# Patient Record
Sex: Male | Born: 1935 | Race: White | Hispanic: No | Marital: Single | Smoking: Former smoker
Health system: Southern US, Community
[De-identification: ages and names within clinical notes are randomized; demographics above are authoritative.]

## PROBLEM LIST (undated history)

## (undated) DIAGNOSIS — I10 Essential (primary) hypertension: Secondary | ICD-10-CM

## (undated) DIAGNOSIS — I4891 Unspecified atrial fibrillation: Secondary | ICD-10-CM

## (undated) DIAGNOSIS — E785 Hyperlipidemia, unspecified: Secondary | ICD-10-CM

## (undated) DIAGNOSIS — H9190 Unspecified hearing loss, unspecified ear: Secondary | ICD-10-CM

## (undated) DIAGNOSIS — I509 Heart failure, unspecified: Secondary | ICD-10-CM

## (undated) DIAGNOSIS — M792 Neuralgia and neuritis, unspecified: Secondary | ICD-10-CM

## (undated) DIAGNOSIS — K219 Gastro-esophageal reflux disease without esophagitis: Secondary | ICD-10-CM

## (undated) HISTORY — PX: COLONOSCOPY: SHX174

## (undated) HISTORY — PX: JOINT REPLACEMENT: SHX530

---

## 2004-07-26 ENCOUNTER — Ambulatory Visit: Payer: Self-pay | Admitting: Otolaryngology

## 2004-08-07 ENCOUNTER — Ambulatory Visit: Payer: Self-pay | Admitting: Otolaryngology

## 2004-12-06 ENCOUNTER — Emergency Department: Payer: Self-pay | Admitting: Emergency Medicine

## 2007-01-29 ENCOUNTER — Ambulatory Visit: Payer: Self-pay | Admitting: Cardiovascular Disease

## 2007-10-17 ENCOUNTER — Ambulatory Visit: Payer: Self-pay | Admitting: Otolaryngology

## 2007-11-12 ENCOUNTER — Ambulatory Visit: Payer: Self-pay | Admitting: Otolaryngology

## 2008-03-28 ENCOUNTER — Ambulatory Visit: Payer: Self-pay | Admitting: Otolaryngology

## 2008-05-31 ENCOUNTER — Ambulatory Visit: Payer: Self-pay | Admitting: Unknown Physician Specialty

## 2008-12-16 ENCOUNTER — Emergency Department: Payer: Self-pay | Admitting: Emergency Medicine

## 2009-07-30 ENCOUNTER — Ambulatory Visit: Payer: Self-pay | Admitting: Otolaryngology

## 2009-09-10 ENCOUNTER — Ambulatory Visit: Payer: Self-pay | Admitting: Otolaryngology

## 2012-04-13 ENCOUNTER — Ambulatory Visit: Payer: Self-pay | Admitting: Ophthalmology

## 2012-04-19 ENCOUNTER — Ambulatory Visit: Payer: Self-pay | Admitting: Ophthalmology

## 2012-06-15 ENCOUNTER — Emergency Department: Payer: Self-pay | Admitting: Emergency Medicine

## 2012-06-22 ENCOUNTER — Emergency Department: Payer: Self-pay | Admitting: Emergency Medicine

## 2012-07-01 ENCOUNTER — Ambulatory Visit: Payer: Self-pay | Admitting: Specialist

## 2013-01-31 ENCOUNTER — Ambulatory Visit: Payer: Self-pay | Admitting: Ophthalmology

## 2013-01-31 LAB — POTASSIUM: Potassium: 3.3 mmol/L — ABNORMAL LOW (ref 3.5–5.1)

## 2013-02-07 ENCOUNTER — Ambulatory Visit: Payer: Self-pay | Admitting: Ophthalmology

## 2014-08-22 NOTE — Op Note (Signed)
PATIENT NAME:  Daniel Barr, Daniel Barr MR#:  [id number] DATE OF BIRTH:  11-18-1931  DATE OF PROCEDURE:  04/19/2012  PREOPERATIVE DIAGNOSIS:  Cataract, left eye.   POSTOPERATIVE DIAGNOSIS:  Cataract, left eye.   PROCEDURE PERFORMED:  Extracapsular cataract extraction using phacoemulsification with placement Alcon [id number], 21.0 diopter posterior chamber lens, serial #[id number].   SURGEON: Maylon PeppersSteven A. Maelyn Berrey, M.D.   ANESTHESIA: 4% lidocaine and 0.75% Marcaine a 50-50 mixture with 10 units/mL of Hylenex  added, given as a peribulbar.   ANESTHESIOLOGIST:  Yves DillPaul Carroll, MD  COMPLICATIONS:  None.   ESTIMATED BLOOD LOSS:  Less than 1 mL.    DESCRIPTION OF PROCEDURE:  The patient was brought to the operating room and given a peribulbar block.  The patient was then prepped and draped in the usual fashion.  The vertical rectus muscles were imbricated using 5-0 silk sutures.  These sutures were then clamped to the sterile drapes as bridle sutures.  A limbal peritomy was performed extending two clock hours and hemostasis was obtained with cautery.  A partial thickness scleral groove was made at the surgical limbus and dissected anteriorly in a lamellar dissection using an Alcon crescent knife.  The anterior chamber was entered supero-temporally with a Superblade and through the lamellar dissection with a 2.6 mm keratome.  DisCoVisc was used to replace the aqueous and a continuous tear capsulorrhexis was carried out.  Hydrodissection and hydrodelineation were carried out with balanced salt and a 27 gauge canula.  The nucleus was rotated to confirm the effectiveness of the hydrodissection.  Phacoemulsification was carried out using a divide-and-conquer technique.  Total ultrasound time was 1 minute, 57.2 seconds with an average power of 22.5 percent.  CDE of 42.24.  Irrigation/aspiration was used to remove the residual cortex.  DisCoVisc was used to inflate the capsule and the internal incision was enlarged to 3  mm with the crescent knife.  The intraocular lens was folded and inserted into the capsular bag using the Alcon AcrySert delivery system.  Irrigation/aspiration was used to remove the residual DisCoVisc.  Miostat was injected into the anterior chamber through the paracentesis track to inflate the anterior chamber and induce miosis.  The wound was checked for leaks and none were found. The conjunctiva was closed with cautery and the bridle sutures were removed.  Two drops of 0.3% Vigamox were placed on the eye.   An eye shield was placed on the eye.  The patient was discharged to the recovery room in good condition.    ____________________________ Maylon PeppersSteven A. Johniya Durfee, MD sad:ct D: 04/19/2012 13:17:54 ET T: 04/20/2012 12:00:33 ET JOB#: [id number]  cc: Viviann SpareSteven A. Manali Mcelmurry, MD, <Dictator> Erline LevineSTEVEN A Treysean Petruzzi MD ELECTRONICALLY SIGNED 04/26/2012 12:49

## 2014-08-25 NOTE — Op Note (Signed)
PATIENT NAME:  Daniel Barr, Josten R MR#:  [id number] DATE OF BIRTH:  26-Jun-1931  DATE OF PROCEDURE:  02/06/2013  PREOPERATIVE DIAGNOSIS: Cataract, right eye.   POSTOPERATIVE DIAGNOSIS: Cataract, right eye.  PROCEDURE PERFORMED: Extracapsular cataract extraction using phacoemulsification with placement Alcon [id number], 21.0 diopter posterior chamber lens, serial number [id number].   SURGEON: Maylon PeppersSteven A. Makaylie Dedeaux, M.D.   ANESTHESIA: 4% lidocaine and 0.75% Marcaine a 50-50 mixture with 10 units/mL of Hylenex added, given as a peribulbar.   COMPLICATIONS: None.   ESTIMATED BLOOD LOSS: Less than 1 mL.   DESCRIPTION OF PROCEDURE: The patient was brought to the operating room and given a peribulbar block.  The patient was then prepped and draped in the usual fashion.  The vertical rectus muscles were imbricated using 5-0 silk sutures.  These sutures were then clamped to the sterile drapes as bridle sutures.  A limbal peritomy was performed extending two clock hours and hemostasis was obtained with cautery.  A partial thickness scleral groove was made at the surgical limbus and then dissected anteriorly in a lamellar dissection with using an Alcon crescent knife.  The anterior chamber was entered superonasally with a Superblade and through the lamellar dissection with a 2.6-mm keratome.  DisCoVisc was used to replace the aqueous and a continuous tear capsulorrhexis was carried out.  Hydrodissection and hydrodelineation were carried out with balanced salt and a 27 gauge canula.  The nucleus was rotated to confirm the effectiveness of the hydrodissection.  Phacoemulsification was carried out using a divide-and-conquer technique.  Total ultrasound time was 1 minute and 53 seconds with an average power of 24.3 %. CDE of 52.08.  Irrigation/aspiration was used to remove the residual cortex.  DisCoVisc was used to inflate the capsule and the internal wound was enlarged to 3 mm with the crescent knife.  The  intraocular lens was inserted into the capsular bag using the AcrySert delivery system.  Irrigation/aspiration was used to remove the residual DisCoVisc.  Miostat was injected into the anterior chamber through the paracentesis track to inflate the anterior chamber and induce miosis.  A 0.1 mL of cefuroxime was injected via the paracentesis track containing 1 mg of drug.  The wound was checked for leaks and wound leakage was found.  A single 10-0 suture was placed across the incision, tied and the knot was rotated superiorly.  The conjunctiva was closed with cautery and the bridle sutures were removed.  Two drops of 0.3% Vigamox were placed on the eye.  An eye shield was placed on the eye.  The patient was discharged to the recovery room in good condition.      ____________________________ Maylon PeppersSteven A. Braelynne Garinger, MD sad:sg D: 02/07/2013 13:10:53 ET T: 02/07/2013 13:27:41 ET JOB#: [id number]  cc: Viviann SpareSteven A. Coltin Casher, MD, <Dictator> Erline LevineSTEVEN A Obera Stauch MD ELECTRONICALLY SIGNED 02/21/2013 12:25

## 2015-09-22 ENCOUNTER — Emergency Department: Payer: Medicare Other

## 2015-09-22 ENCOUNTER — Inpatient Hospital Stay
Admission: EM | Admit: 2015-09-22 | Discharge: 2015-09-25 | DRG: 917 | Disposition: A | Discharge status: Home / Self Care | Payer: Medicare Other | Attending: Internal Medicine | Admitting: Internal Medicine

## 2015-09-22 ENCOUNTER — Encounter: Payer: Self-pay | Admitting: Emergency Medicine

## 2015-09-22 DIAGNOSIS — Z79899 Other long term (current) drug therapy: Secondary | ICD-10-CM | POA: Diagnosis not present

## 2015-09-22 DIAGNOSIS — I110 Hypertensive heart disease with heart failure: Secondary | ICD-10-CM | POA: Diagnosis present

## 2015-09-22 DIAGNOSIS — Z809 Family history of malignant neoplasm, unspecified: Secondary | ICD-10-CM

## 2015-09-22 DIAGNOSIS — I482 Chronic atrial fibrillation: Secondary | ICD-10-CM | POA: Diagnosis present

## 2015-09-22 DIAGNOSIS — Z66 Do not resuscitate: Secondary | ICD-10-CM | POA: Diagnosis present

## 2015-09-22 DIAGNOSIS — T404X1A Poisoning by other synthetic narcotics, accidental (unintentional), initial encounter: Secondary | ICD-10-CM | POA: Diagnosis present

## 2015-09-22 DIAGNOSIS — T426X1A Poisoning by other antiepileptic and sedative-hypnotic drugs, accidental (unintentional), initial encounter: Secondary | ICD-10-CM | POA: Diagnosis present

## 2015-09-22 DIAGNOSIS — I4891 Unspecified atrial fibrillation: Secondary | ICD-10-CM | POA: Diagnosis present

## 2015-09-22 DIAGNOSIS — R4182 Altered mental status, unspecified: Secondary | ICD-10-CM

## 2015-09-22 DIAGNOSIS — Z8249 Family history of ischemic heart disease and other diseases of the circulatory system: Secondary | ICD-10-CM

## 2015-09-22 DIAGNOSIS — D72829 Elevated white blood cell count, unspecified: Secondary | ICD-10-CM | POA: Diagnosis present

## 2015-09-22 DIAGNOSIS — Z9660 Presence of unspecified orthopedic joint implant: Secondary | ICD-10-CM | POA: Diagnosis present

## 2015-09-22 DIAGNOSIS — E785 Hyperlipidemia, unspecified: Secondary | ICD-10-CM | POA: Diagnosis present

## 2015-09-22 DIAGNOSIS — T481X1A Poisoning by skeletal muscle relaxants [neuromuscular blocking agents], accidental (unintentional), initial encounter: Principal | ICD-10-CM | POA: Diagnosis present

## 2015-09-22 DIAGNOSIS — G9340 Encephalopathy, unspecified: Secondary | ICD-10-CM | POA: Diagnosis present

## 2015-09-22 DIAGNOSIS — E876 Hypokalemia: Secondary | ICD-10-CM | POA: Diagnosis not present

## 2015-09-22 DIAGNOSIS — E860 Dehydration: Secondary | ICD-10-CM | POA: Diagnosis present

## 2015-09-22 DIAGNOSIS — R531 Weakness: Secondary | ICD-10-CM

## 2015-09-22 DIAGNOSIS — Z823 Family history of stroke: Secondary | ICD-10-CM

## 2015-09-22 DIAGNOSIS — Z87891 Personal history of nicotine dependence: Secondary | ICD-10-CM

## 2015-09-22 DIAGNOSIS — K219 Gastro-esophageal reflux disease without esophagitis: Secondary | ICD-10-CM | POA: Diagnosis present

## 2015-09-22 DIAGNOSIS — F0390 Unspecified dementia without behavioral disturbance: Secondary | ICD-10-CM | POA: Diagnosis present

## 2015-09-22 DIAGNOSIS — I509 Heart failure, unspecified: Secondary | ICD-10-CM | POA: Diagnosis present

## 2015-09-22 DIAGNOSIS — H9190 Unspecified hearing loss, unspecified ear: Secondary | ICD-10-CM | POA: Diagnosis present

## 2015-09-22 DIAGNOSIS — I10 Essential (primary) hypertension: Secondary | ICD-10-CM | POA: Diagnosis present

## 2015-09-22 HISTORY — DX: Gastro-esophageal reflux disease without esophagitis: K21.9

## 2015-09-22 HISTORY — DX: Unspecified hearing loss, unspecified ear: H91.90

## 2015-09-22 HISTORY — DX: Neuralgia and neuritis, unspecified: M79.2

## 2015-09-22 HISTORY — DX: Heart failure, unspecified: I50.9

## 2015-09-22 HISTORY — DX: Essential (primary) hypertension: I10

## 2015-09-22 HISTORY — DX: Unspecified atrial fibrillation: I48.91

## 2015-09-22 HISTORY — DX: Hyperlipidemia, unspecified: E78.5

## 2015-09-22 LAB — CBC
HCT: 46.5 % (ref 40.0–52.0)
Hemoglobin: 15.7 g/dL (ref 13.0–18.0)
MCH: 29.9 pg (ref 26.0–34.0)
MCHC: 33.7 g/dL (ref 32.0–36.0)
MCV: 88.6 fL (ref 80.0–100.0)
Platelets: 331 10*3/uL (ref 150–440)
RBC: 5.25 MIL/uL (ref 4.40–5.90)
RDW: 15.5 % — AB (ref 11.5–14.5)
WBC: 13.8 10*3/uL — ABNORMAL HIGH (ref 3.8–10.6)

## 2015-09-22 LAB — URINE DRUG SCREEN, QUALITATIVE (ARMC ONLY)
AMPHETAMINES, UR SCREEN: NOT DETECTED
BENZODIAZEPINE, UR SCRN: NOT DETECTED
Barbiturates, Ur Screen: NOT DETECTED
CANNABINOID 50 NG, UR ~~LOC~~: NOT DETECTED
Cocaine Metabolite,Ur ~~LOC~~: NOT DETECTED
MDMA (ECSTASY) UR SCREEN: NOT DETECTED
Methadone Scn, Ur: NOT DETECTED
Opiate, Ur Screen: NOT DETECTED
Phencyclidine (PCP) Ur S: NOT DETECTED
TRICYCLIC, UR SCREEN: NOT DETECTED

## 2015-09-22 LAB — DIFFERENTIAL
BASOS ABS: 0.1 10*3/uL (ref 0–0.1)
BASOS PCT: 1 %
BLASTS: 0 %
Band Neutrophils: 0 %
EOS PCT: 0 %
Eosinophils Absolute: 0 10*3/uL (ref 0–0.7)
LYMPHS PCT: 16 %
Lymphs Abs: 2.2 10*3/uL (ref 1.0–3.6)
MONOS PCT: 10 %
MYELOCYTES: 0 %
Metamyelocytes Relative: 0 %
Monocytes Absolute: 1.4 10*3/uL — ABNORMAL HIGH (ref 0.2–1.0)
NEUTROS ABS: 10.1 10*3/uL — AB (ref 1.4–6.5)
NEUTROS PCT: 73 %
Other: 0 %
Promyelocytes Absolute: 0 %
nRBC: 0 /100 WBC

## 2015-09-22 LAB — COMPREHENSIVE METABOLIC PANEL
ALK PHOS: 62 U/L (ref 38–126)
ALT: 24 U/L (ref 17–63)
AST: 44 U/L — ABNORMAL HIGH (ref 15–41)
Albumin: 4.1 g/dL (ref 3.5–5.0)
Anion gap: 11 (ref 5–15)
BILIRUBIN TOTAL: 1 mg/dL (ref 0.3–1.2)
BUN: 10 mg/dL (ref 6–20)
CALCIUM: 9 mg/dL (ref 8.9–10.3)
CO2: 24 mmol/L (ref 22–32)
CREATININE: 1.05 mg/dL (ref 0.61–1.24)
Chloride: 109 mmol/L (ref 101–111)
GFR calc Af Amer: >60 mL/min (ref >60)
GFR calc non Af Amer: >60 mL/min (ref >60)
GLUCOSE: 116 mg/dL — AB (ref 65–99)
Potassium: 2.7 mmol/L — CL (ref 3.5–5.1)
Sodium: 144 mmol/L (ref 135–145)
TOTAL PROTEIN: 7.3 g/dL (ref 6.5–8.1)

## 2015-09-22 LAB — URINALYSIS COMPLETE WITH MICROSCOPIC (ARMC ONLY)
BILIRUBIN URINE: NEGATIVE
Bacteria, UA: NONE SEEN
Glucose, UA: NEGATIVE mg/dL
Leukocytes, UA: NEGATIVE
Nitrite: NEGATIVE
PROTEIN: 100 mg/dL — AB
Specific Gravity, Urine: 1.021 (ref 1.005–1.030)
pH: 5 (ref 5.0–8.0)

## 2015-09-22 LAB — ACETAMINOPHEN LEVEL: Acetaminophen (Tylenol), Serum: <10 ug/mL — ABNORMAL LOW (ref 10–30)

## 2015-09-22 LAB — SALICYLATE LEVEL

## 2015-09-22 LAB — ETHANOL

## 2015-09-22 LAB — TROPONIN I

## 2015-09-22 MED ORDER — SODIUM CHLORIDE 0.9 % IV BOLUS (SEPSIS)
500.0000 mL | Freq: Once | INTRAVENOUS | Status: AC
  Administered 2015-09-22: 500 mL via INTRAVENOUS

## 2015-09-22 MED ORDER — PANTOPRAZOLE SODIUM 40 MG PO TBEC
40.0000 mg | DELAYED_RELEASE_TABLET | Freq: Every day | ORAL | Status: DC
  Administered 2015-09-23 – 2015-09-25 (×3): 40 mg via ORAL
  Filled 2015-09-22 (×4): qty 1

## 2015-09-22 MED ORDER — SOTALOL HCL 80 MG PO TABS
80.0000 mg | ORAL_TABLET | Freq: Two times a day (BID) | ORAL | Status: DC
  Administered 2015-09-23 – 2015-09-25 (×6): 80 mg via ORAL
  Filled 2015-09-22 (×8): qty 1

## 2015-09-22 MED ORDER — ENOXAPARIN SODIUM 40 MG/0.4ML ~~LOC~~ SOLN
40.0000 mg | SUBCUTANEOUS | Status: DC
  Administered 2015-09-23 – 2015-09-24 (×2): 40 mg via SUBCUTANEOUS
  Filled 2015-09-22 (×2): qty 0.4

## 2015-09-22 MED ORDER — HYDRALAZINE HCL 20 MG/ML IJ SOLN
10.0000 mg | INTRAMUSCULAR | Status: DC | PRN
  Administered 2015-09-22 – 2015-09-23 (×2): 10 mg via INTRAVENOUS
  Filled 2015-09-22 (×2): qty 1

## 2015-09-22 MED ORDER — SODIUM CHLORIDE 0.9% FLUSH
3.0000 mL | Freq: Two times a day (BID) | INTRAVENOUS | Status: DC
  Administered 2015-09-23 – 2015-09-25 (×6): 3 mL via INTRAVENOUS

## 2015-09-22 MED ORDER — ONDANSETRON HCL 4 MG/2ML IJ SOLN: 4.0000 mg | Freq: Four times a day (QID) | INTRAMUSCULAR | Status: DC | PRN

## 2015-09-22 MED ORDER — SODIUM CHLORIDE 0.9 % IV SOLN
INTRAVENOUS | Status: DC
  Administered 2015-09-22 – 2015-09-23 (×2): via INTRAVENOUS

## 2015-09-22 MED ORDER — POTASSIUM CHLORIDE CRYS ER 20 MEQ PO TBCR
40.0000 meq | EXTENDED_RELEASE_TABLET | Freq: Once | ORAL | Status: AC
  Administered 2015-09-22: 40 meq via ORAL
  Filled 2015-09-22: qty 2

## 2015-09-22 MED ORDER — ACETAMINOPHEN 325 MG PO TABS
650.0000 mg | ORAL_TABLET | Freq: Four times a day (QID) | ORAL | Status: DC | PRN
  Administered 2015-09-23: 650 mg via ORAL

## 2015-09-22 MED ORDER — ACETAMINOPHEN 650 MG RE SUPP: 650.0000 mg | Freq: Four times a day (QID) | RECTAL | Status: DC | PRN

## 2015-09-22 MED ORDER — IOPAMIDOL (ISOVUE-370) INJECTION 76%
75.0000 mL | Freq: Once | INTRAVENOUS | Status: AC | PRN
  Administered 2015-09-22: 75 mL via INTRAVENOUS

## 2015-09-22 MED ORDER — ONDANSETRON HCL 4 MG PO TABS: 4.0000 mg | ORAL_TABLET | Freq: Four times a day (QID) | ORAL | Status: DC | PRN

## 2015-09-22 NOTE — ED Notes (Signed)
Pt to ed with son who reports recent altered mental status and confusion progressively worse over the last 10 days.  Per son pt lives a lone and was found this am with pills everywhere at home, on the floor and all over the table, pt acutely confused.  Pt alert to self and date of birth.  Pt confused to place.  Pt very hard of hearing.

## 2015-09-22 NOTE — H&P (Signed)
[facility] - [place] at [facility]   PATIENT NAME: Daniel Barr    MR#:  [id number]  DATE OF BIRTH:  1931-09-19  DATE OF ADMISSION:  09/22/2015  PRIMARY CARE PHYSICIAN: Derwood Kaplan, MD   REQUESTING/REFERRING PHYSICIAN: Inocencio Homes, MD  CHIEF COMPLAINT:   Chief Complaint  Patient presents with  . Altered Mental Status    HISTORY OF PRESENT ILLNESS:  Daniel Barr  is a 82 y.o. male who presents with Increasing confusion for the past 5-6 days, very much acutely worse over the last 2 days. Patient is unable to contribute to his own history of present illness due to his confusion. History is taken from his son who is present at bedside with him. His son states the patient lives at home alone and has been able to complete his activities of daily living and care for himself without much difficulty until the past week or so. He states that a family friend told him that patient has been calling her for the past 5-6 days and had been confused about time of day frequently. Over the last 2 days the patient has been disoriented to place, time, circumstance. Son states that when he arrived to the patient's home his medications were not appropriately laid out, and some were even scattered around. He brought patient to the ED for evaluation. Workup here is largely negative initially in the ED, except for a very mild leukocytosis with a white count of 13.8, but a normal differential. No apparent infection found on imaging or lab workup. Hospitalists were called for admission and further evaluation.  PAST MEDICAL HISTORY:   Past Medical History  Diagnosis Date  . Hard of hearing   . Hypertension   . Nerve pain   . CHF (congestive heart failure) (HCC)   . GERD (gastroesophageal reflux disease)   . HLD (hyperlipidemia)   . A-fib [facility])     PAST SURGICAL HISTORY:   Past Surgical History  Procedure Laterality Date  . Colonoscopy    . Joint replacement      SOCIAL HISTORY:    Social History  Substance Use Topics  . Smoking status: Former Games developer  . Smokeless tobacco: Not on file  . Alcohol Use: Yes    FAMILY HISTORY:   Family History  Problem Relation Age of Onset  . Hypertension    . Stroke    . Cancer      DRUG ALLERGIES:  No Known Allergies  MEDICATIONS AT HOME:   Prior to Admission medications   Medication Sig Start Date End Date Taking? Authorizing Provider  sotalol (BETAPACE) 80 MG tablet Take 80 mg by mouth 2 (two) times daily.   Yes Historical Provider, MD    REVIEW OF SYSTEMS:  Review of Systems  Unable to perform ROS: acuity of condition     VITAL SIGNS:   Filed Vitals:   09/22/15 1700 09/22/15 1850 09/22/15 2003 09/22/15 2030  BP: 186/97 140/93 174/74 145/75  Pulse: 84 84 75 74  Temp:      TempSrc:      Resp: 17 14 20 15   Height:      Weight:      SpO2: 94% 94% 93% 95%   Wt Readings from Last 3 Encounters:  09/22/15 113.399 kg (250 lb)    PHYSICAL EXAMINATION:  Physical Exam  Vitals reviewed. Constitutional: He appears well-developed and well-nourished. No distress.  HENT:  Head: Normocephalic and atraumatic.  Mouth/Throat: Oropharynx is clear and moist.  Eyes: Conjunctivae and EOM are normal. Pupils are equal, round, and reactive to light. No scleral icterus.  Neck: Normal range of motion. Neck supple. No JVD present. No thyromegaly present.  Cardiovascular: Normal rate, regular rhythm and intact distal pulses.  Exam reveals no gallop and no friction rub.   No murmur heard. Respiratory: Effort normal and breath sounds normal. No respiratory distress. He has no wheezes. He has no rales.  GI: Soft. Bowel sounds are normal. He exhibits no distension. There is no tenderness.  Musculoskeletal: Normal range of motion. He exhibits no edema.  No arthritis, no gout  Lymphadenopathy:    He has no cervical adenopathy.  Neurological: He is alert. No cranial nerve deficit.  Responds to verbal stimuli, converses and  follows commands, but is confused in all communication.  No dysarthria, no aphasia  Skin: Skin is warm and dry. No rash noted. No erythema.  Psychiatric:  Unable to assess due to patient condition    LABORATORY PANEL:   CBC  Recent Labs Lab 09/22/15 1409  WBC 13.8*  HGB 15.7  HCT 46.5  PLT 331   ------------------------------------------------------------------------------------------------------------------  Chemistries   Recent Labs Lab 09/22/15 1409  NA 144  K 2.7*  CL 109  CO2 24  GLUCOSE 116*  BUN 10  CREATININE 1.05  CALCIUM 9.0  AST 44*  ALT 24  ALKPHOS 62  BILITOT 1.0   ------------------------------------------------------------------------------------------------------------------  Cardiac Enzymes  Recent Labs Lab 09/22/15 1409  TROPONINI <0.03   ------------------------------------------------------------------------------------------------------------------  RADIOLOGY:  Dg Chest 2 View  09/22/2015  CLINICAL DATA:  Altered mental status and confusion, progressively worse over the last 10 days. History of hypertension, CHF. EXAM: CHEST  2 VIEW COMPARISON:  None. FINDINGS: There is mild cardiomegaly. Upper mediastinum is widened to approximately 9 cm, possibly related to thyroid goiter, possibly mediastinal lipomatosis. Lungs are clear. No evidence of pneumonia. No pleural effusion or pneumothorax seen. No acute- appearing osseous abnormality. IMPRESSION: 1. Lungs are clear and there is no evidence of acute cardiopulmonary abnormality. 2. Mild cardiomegaly.  No evidence of CHF. 3. Widened mediastinum, possibly due to thyroid goiter, possibly mediastinal lipomatosis. No prior exams available to assess chronicity. If any chest pain, would certainly consider chest CT for further characterization. Electronically Signed   By: Bary Richard M.D.   On: 09/22/2015 16:48   Ct Head Wo Contrast  09/22/2015  CLINICAL DATA:  Altered mental status, confusion EXAM: CT  HEAD WITHOUT CONTRAST TECHNIQUE: Contiguous axial images were obtained from the base of the skull through the vertex without intravenous contrast. COMPARISON:  None. FINDINGS: No evidence of parenchymal hemorrhage or extra-axial fluid collection. No mass lesion, mass effect, or midline shift. No CT evidence of acute infarction. Small vessel ischemic changes, particularly in the subcortical right frontal lobe. Mild intracranial atherosclerosis. Global cortical atrophy.  No ventriculomegaly. New complete opacification of the right sphenoid sinus. Visualized paranasal sinuses and mastoid air cells otherwise clear. No evidence of calvarial fracture. IMPRESSION: No evidence of acute intracranial abnormality. Atrophy with small vessel ischemic changes. Electronically Signed   By: Charline Bills M.D.   On: 09/22/2015 16:10   Ct Angio Chest Aorta W/cm &/or Wo/cm  09/22/2015  CLINICAL DATA:  Acute onset of confusion. Widened mediastinum on chest radiograph. Initial encounter. EXAM: CT ANGIOGRAPHY CHEST WITH CONTRAST TECHNIQUE: Multidetector CT imaging of the chest was performed using the standard protocol during bolus administration of intravenous contrast. Multiplanar CT image reconstructions and MIPs were obtained to evaluate the vascular anatomy. CONTRAST:  75 mL of Isovue 370 IV contrast COMPARISON:  Chest radiograph performed earlier today at 4:05 p.m. FINDINGS: Scattered nodules are seen within the right lung, measuring up to 1.2 cm and 0.7 cm in size (image 33 of 60). Smaller nodules are seen on image 25 of 60, measuring 6 mm and 5 mm in size. Given the multiplicity of nodules, these are likely postinfectious in nature. Minimal bibasilar atelectasis is noted. No pleural effusion or pneumothorax is seen. The mediastinum is unremarkable in appearance. No mediastinal lymphadenopathy is seen. No pericardial effusion is identified. Scattered calcification is seen along the thoracic aorta. No mediastinal  lymphadenopathy is appreciated. The great vessels are grossly unremarkable in appearance. Apparent prominence of the superior mediastinum simply reflects fat and normal vasculature. Minimal hypodensities within the thyroid gland are likely benign, given their size. No axillary lymphadenopathy is seen. The visualized portions of the liver and spleen are grossly unremarkable appearance. The gallbladder is within normal limits. The visualized portions of the pancreas and right adrenal gland are within normal limits. Several nodules are seen at the left adrenal gland, measuring 1.9 cm and 2.0 cm in size. The visualized portions of the kidneys are grossly unremarkable, aside from mild nonspecific perinephric stranding. Mild scattered calcification is seen along the proximal abdominal aorta. No acute osseous abnormalities are identified. Review of the MIP images confirms the above findings. IMPRESSION: 1. Mediastinum is unremarkable in appearance. Prominence superior mediastinum to the reflects fat and normal vasculature. 2. Scattered right lung nodules, measuring up to 1.2 cm, are more likely postinfectious in nature, though PET/CT could be considered if deemed clinically appropriate to exclude underlying malignancy. 3. Minimal bibasilar atelectasis noted. 4. Several left adrenal gland nodules noted, measuring up to 2.0 cm in size. Would correlate with lab values and consider adrenal protocol MRI or CT for further evaluation. Electronically Signed   By: Roanna Raider M.D.   On: 09/22/2015 19:25    EKG:   Orders placed or performed during the hospital encounter of 09/22/15  . ED EKG  . ED EKG  . EKG 12-Lead  . EKG 12-Lead    IMPRESSION AND PLAN:  Principal Problem:   Acute encephalopathy - unclear etiology at this time. Seems unlikely to be infection, though blood and urine cultures were sent from the ED. Stroke is certainly one possibility here, the patient does not have any weakness or any other symptom  signs his confusion. Patient is on tramadol and Lyrica and Flexeril, which could be causing his confusion he took mistaken doses of these medicines. We will admit him here, hydrate him, monitor closely, and get a neurology consult for tomorrow to see if they recommend further neuro imaging. Active Problems:   HTN (hypertension) - at goal at this time, we will use when necessary IV antihypertensives until his home meds can be reconciled by pharmacy.   A-fib (HCC) - currently in sinus rhythm on sotalol, we're able to acquire this dose and will continue this medication for rate and rhythm control.   GERD (gastroesophageal reflux disease) - equivalent home dose PPI  All the records are reviewed and case discussed with ED provider. Management plans discussed with the patient and/or family.  DVT PROPHYLAXIS: SubQ lovenox  GI PROPHYLAXIS: PPI  ADMISSION STATUS: Inpatient  CODE STATUS: DNR Code Status History    This patient does not have a recorded code status. Please follow your organizational policy for patients in this situation.      TOTAL TIME TAKING CARE OF  THIS PATIENT: 50 minutes.    Enya Bureau FIELDING 09/22/2015, 9:06 PM  [facility] [place] [facility]  Office  [phone]  CC: Primary care physician; Derwood KaplanEason,  Ernest B, MD

## 2015-09-22 NOTE — ED Notes (Signed)
Pt transported to CT ?

## 2015-09-22 NOTE — ED Notes (Signed)
K 2.7 per lab, charge RN notified, will room pt shortly per charge.

## 2015-09-22 NOTE — ED Provider Notes (Signed)
[facility] Emergency Department Provider Note   ____________________________________________  Time seen: Approximately 3:30 PM  I have reviewed the triage vital signs and the nursing notes.   HISTORY  Chief Complaint Altered Mental Status  Caveat-history of present illness and review of systems is limited due to the patient's confusion as well as severe hardness of hearing. Information is obtained from his son at the bedside.  HPI Daniel Barr is a 82 y.o. male with history of atrial fibrillation on aspirin, hypertension, hyperlipidemia, GERD, history of CHF presents for evaluation of one week of worsening confusion, severe today, constant since onset, no modifying factors. Son reports that he has been very confused, talking about things that happened in 1975. Today his son found him at home disheveled with his pills scattered all over the house. Son denies any recent illness including no cough, vomiting, diarrhea, fevers or chills. Patient denies any pain complaints.   Past Medical History  Diagnosis Date  . Hard of hearing   . Hypertension   . Nerve pain   . CHF (congestive heart failure) (HCC)   . GERD (gastroesophageal reflux disease)     There are no active problems to display for this patient.   History reviewed. No pertinent past surgical history.  No current outpatient prescriptions on file.  Allergies Review of patient's allergies indicates no known allergies.  History reviewed. No pertinent family history.  Social History Social History  Substance Use Topics  . Smoking status: Former Games developer  . Smokeless tobacco: None  . Alcohol Use: Yes    Review of Systems  Caveat-history of present illness and review of systems is limited due to the patient's confusion as well as severe hardness of hearing. Information is obtained from his son at the bedside. ____________________________________________   PHYSICAL EXAM:  VITAL  SIGNS: ED Triage Vitals  Enc Vitals Group     BP 09/22/15 1400 170/97 mmHg     Pulse Rate 09/22/15 1400 48     Resp 09/22/15 1400 20     Temp 09/22/15 1400 97.7 F (36.5 C)     Temp Source 09/22/15 1400 Oral     SpO2 09/22/15 1400 94 %     Weight 09/22/15 1400 250 lb (113.399 kg)     Height 09/22/15 1400  (1.803 m)     Head Cir --      Peak Flow --      Pain Score 09/22/15 1400 0     Pain Loc --      Pain Edu? --      Excl. in GC? --     Constitutional: Alert and oriented to self only. Well appearing and in no acute distress. Very hard of hearing. Eyes: Conjunctivae are normal. PERRL. EOMI. Head: Atraumatic. Nose: No congestion/rhinnorhea. Mouth/Throat: Mucous membranes are moist.  Oropharynx non-erythematous. Neck: No stridor.  No cervical spine tenderness to palpation. Cardiovascular: Normal rate, regular rhythm. Grossly normal heart sounds.  Good peripheral circulation. Respiratory: Normal respiratory effort.  No retractions. Lungs CTAB. Gastrointestinal: Soft and nontender. No distention.  No CVA tenderness. Genitourinary: deferred Musculoskeletal: No lower extremity tenderness nor edema.  No joint effusions. Neurologic:  Normal speech and language. No gross focal neurologic deficits are appreciated. Follows commands to move all extremities and does so equally. Skin:  Skin is warm, dry and intact. No rash noted. Psychiatric: Mood and affect are normal. Speech and behavior are normal.  ____________________________________________   LABS (all labs ordered are listed,  but only abnormal results are displayed)  Labs Reviewed  COMPREHENSIVE METABOLIC PANEL - Abnormal; Notable for the following:    Potassium 2.7 (*)    Glucose, Bld 116 (*)    AST 44 (*)    All other components within normal limits  CBC - Abnormal; Notable for the following:    WBC 13.8 (*)    RDW 15.5 (*)    All other components within normal limits  URINALYSIS COMPLETEWITH MICROSCOPIC ([facility]  ONLY) - Abnormal; Notable for the following:    Color, Urine YELLOW (*)    APPearance CLEAR (*)    Ketones, ur TRACE (*)    Hgb urine dipstick 1+ (*)    Protein, ur 100 (*)    Squamous Epithelial / LPF 0-5 (*)    All other components within normal limits  ACETAMINOPHEN LEVEL - Abnormal; Notable for the following:    Acetaminophen (Tylenol), Serum <10 (*)    All other components within normal limits  CULTURE, BLOOD (ROUTINE X 2)  CULTURE, BLOOD (ROUTINE X 2)  TROPONIN I  SALICYLATE LEVEL  ETHANOL  URINE DRUG SCREEN, QUALITATIVE ([facility] ONLY)  CBG MONITORING, ED   ____________________________________________  EKG  ED ECG REPORT I, Gayla Doss, the attending physician, personally viewed and interpreted this ECG.   Date: 09/22/2015  EKG Time: 16:25  Rate: 74  Rhythm: normal sinus rhythm  Axis: normal  Intervals:none  ST&T Change: No acute ST elevation. No acute ST depression. Nonspecific T-wave abnormality. Wandering baseline somewhat limits the interpretation.  ____________________________________________  RADIOLOGY  CXR IMPRESSION: 1. Lungs are clear and there is no evidence of acute cardiopulmonary abnormality. 2. Mild cardiomegaly. No evidence of CHF. 3. Widened mediastinum, possibly due to thyroid goiter, possibly mediastinal lipomatosis. No prior exams available to assess chronicity. If any chest pain, would certainly consider chest CT for further characterization.  CT head IMPRESSION: No evidence of acute intracranial abnormality.  Atrophy with small vessel ischemic changes.  CTA chest IMPRESSION: 1. Mediastinum is unremarkable in appearance. Prominence superior mediastinum to the reflects fat and normal vasculature. 2. Scattered right lung nodules, measuring up to 1.2 cm, are more likely postinfectious in nature, though PET/CT could be considered if deemed clinically appropriate to exclude underlying malignancy. 3. Minimal bibasilar atelectasis  noted. 4. Several left adrenal gland nodules noted, measuring up to 2.0 cm in size. Would correlate with lab values and consider adrenal protocol MRI or CT for further evaluation.   ____________________________________________   PROCEDURES  Procedure(s) performed: None  Critical Care performed: No  ____________________________________________   INITIAL IMPRESSION / ASSESSMENT AND PLAN / ED COURSE  Pertinent labs & imaging results that were available during my care of the patient were reviewed by me and considered in my medical decision making (see chart for details).  Daniel Barr is a 82 y.o. male with history of atrial fibrillation on aspirin, hypertension, hyperlipidemia, GERD, history of CHF presents for evaluation of one week of worsening confusion. On exam, he is nontoxic appearing and in no acute distress, vital signs are stable, he is afebrile, he has no acute pain complaints but is only oriented to self. He appears to have an intact neurological examination. Reviewed are notable for severe hypokalemia, potassium of 2.7, we'll replete and anticipate admission for lab. His white blood count elevated at 13.8, we'll obtain chest x-ray as well as urinalysis to evaluate for possible infectious etiologies. Blood culture sent. Negative troponin, undetectable acetaminophen, salicylate and ethanol levels. CT head shows no acute  intracranial process. Anticipate admission given rapid decline in his mental status, ability to care for himself and the fact that he lives independently.  ----------------------------------------- 8:06 PM on 09/22/2015 ----------------------------------------- Chest x-ray showed no acute infectious process however there was a question of mediastinal widening. CTA chest confirms no widening of the mediastinum. Urinalysis is not consistent with infection. Case discussed with the hospitalist, Dr. Amado CoeGouru, for admission given his encephalopathy and  hypokalemia.  ____________________________________________   FINAL CLINICAL IMPRESSION(S) / ED DIAGNOSES  Final diagnoses:  Altered mental status, unspecified altered mental status type  Encephalopathy acute  Hypokalemia      NEW MEDICATIONS STARTED DURING THIS VISIT:  New Prescriptions   No medications on file     Note:  This document was prepared using Dragon voice recognition software and may include unintentional dictation errors.    Gayla DossEryka A Eliezer Khawaja, MD 09/22/15 2007

## 2015-09-23 DIAGNOSIS — G9340 Encephalopathy, unspecified: Secondary | ICD-10-CM

## 2015-09-23 LAB — BASIC METABOLIC PANEL
ANION GAP: 8 (ref 5–15)
BUN: 7 mg/dL (ref 6–20)
CALCIUM: 8.7 mg/dL — AB (ref 8.9–10.3)
CO2: 23 mmol/L (ref 22–32)
Chloride: 113 mmol/L — ABNORMAL HIGH (ref 101–111)
Creatinine, Ser: 0.71 mg/dL (ref 0.61–1.24)
Glucose, Bld: 123 mg/dL — ABNORMAL HIGH (ref 65–99)
Potassium: 2.7 mmol/L — CL (ref 3.5–5.1)
Sodium: 144 mmol/L (ref 135–145)

## 2015-09-23 LAB — CBC
HCT: 47.4 % (ref 40.0–52.0)
HEMOGLOBIN: 15.7 g/dL (ref 13.0–18.0)
MCH: 29.3 pg (ref 26.0–34.0)
MCHC: 33.3 g/dL (ref 32.0–36.0)
MCV: 88.2 fL (ref 80.0–100.0)
Platelets: 295 10*3/uL (ref 150–440)
RBC: 5.37 MIL/uL (ref 4.40–5.90)
RDW: 15.5 % — ABNORMAL HIGH (ref 11.5–14.5)
WBC: 14 10*3/uL — AB (ref 3.8–10.6)

## 2015-09-23 LAB — MAGNESIUM: MAGNESIUM: 2 mg/dL (ref 1.7–2.4)

## 2015-09-23 MED ORDER — ASPIRIN EC 81 MG PO TBEC
81.0000 mg | DELAYED_RELEASE_TABLET | Freq: Every day | ORAL | Status: DC
  Administered 2015-09-23 – 2015-09-25 (×3): 81 mg via ORAL
  Filled 2015-09-23 (×3): qty 1

## 2015-09-23 MED ORDER — TRAMADOL HCL 50 MG PO TABS
50.0000 mg | ORAL_TABLET | Freq: Four times a day (QID) | ORAL | Status: DC | PRN
  Administered 2015-09-23: 50 mg via ORAL
  Filled 2015-09-23: qty 1

## 2015-09-23 MED ORDER — ZIPRASIDONE MESYLATE 20 MG IM SOLR
10.0000 mg | Freq: Once | INTRAMUSCULAR | Status: AC
  Administered 2015-09-24: 10 mg via INTRAMUSCULAR
  Filled 2015-09-23 (×2): qty 20

## 2015-09-23 MED ORDER — POTASSIUM CHLORIDE CRYS ER 20 MEQ PO TBCR
20.0000 meq | EXTENDED_RELEASE_TABLET | Freq: Every day | ORAL | Status: DC
  Administered 2015-09-23 – 2015-09-24 (×2): 20 meq via ORAL
  Filled 2015-09-23 (×2): qty 1

## 2015-09-23 MED ORDER — AMLODIPINE BESYLATE 10 MG PO TABS
10.0000 mg | ORAL_TABLET | Freq: Every day | ORAL | Status: DC
  Administered 2015-09-23 – 2015-09-25 (×3): 10 mg via ORAL
  Filled 2015-09-23 (×3): qty 1

## 2015-09-23 MED ORDER — ENALAPRIL MALEATE 5 MG PO TABS
20.0000 mg | ORAL_TABLET | Freq: Two times a day (BID) | ORAL | Status: DC
  Administered 2015-09-23 – 2015-09-25 (×5): 20 mg via ORAL
  Filled 2015-09-23 (×5): qty 4

## 2015-09-23 MED ORDER — TORSEMIDE 20 MG PO TABS
20.0000 mg | ORAL_TABLET | Freq: Every day | ORAL | Status: DC
  Administered 2015-09-23 – 2015-09-25 (×3): 20 mg via ORAL
  Filled 2015-09-23 (×3): qty 1

## 2015-09-23 MED ORDER — POTASSIUM CHLORIDE CRYS ER 20 MEQ PO TBCR
40.0000 meq | EXTENDED_RELEASE_TABLET | Freq: Two times a day (BID) | ORAL | Status: AC
  Administered 2015-09-23 (×2): 40 meq via ORAL
  Filled 2015-09-23 (×2): qty 2

## 2015-09-23 NOTE — Progress Notes (Signed)
[facility] - [place] [facility]   PATIENT NAME: Daniel Barr    MR#:  [id number]  DATE OF BIRTH:  11-Sep-1931  SUBJECTIVE:  CHIEF COMPLAINT:   Chief Complaint  Patient presents with  . Altered Mental Status   The patient is very hard of hearing and demented. REVIEW OF SYSTEMS:  Demented, unable to get ROS.   DRUG ALLERGIES:  No Known Allergies  VITALS:  Blood pressure 175/79, pulse 83, temperature 98.8 F (37.1 C), temperature source Oral, resp. rate 20, height  (1.803 m), weight 102.604 kg (226 lb 3.2 oz), SpO2 95 %.  PHYSICAL EXAMINATION:  GENERAL:  82 y.o.-year-old patient lying in the bed with no acute distress.  EYES: Pupils equal, round, reactive to light and accommodation. No scleral icterus. Extraocular muscles intact.  HEENT: Head atraumatic, normocephalic. Oropharynx and nasopharynx clear.  NECK:  Supple, no jugular venous distention. No thyroid enlargement, no tenderness.  LUNGS: Normal breath sounds bilaterally, no wheezing, rales,rhonchi or crepitation. No use of accessory muscles of respiration.  CARDIOVASCULAR: S1, S2 normal. No murmurs, rubs, or gallops.  ABDOMEN: Soft, nontender, nondistended. Bowel sounds present. No organomegaly or mass.  EXTREMITIES: No pedal edema, cyanosis, or clubbing.  NEUROLOGIC: Cranial nerves II through XII are intact. Muscle strength 3/5 in all extremities. Sensation intact. Gait not checked.  PSYCHIATRIC: The patient is Demented.Marland Kitchen  SKIN: No obvious rash, lesion, or ulcer.    LABORATORY PANEL:   CBC  Recent Labs Lab 09/23/15 0607  WBC 14.0*  HGB 15.7  HCT 47.4  PLT 295   ------------------------------------------------------------------------------------------------------------------  Chemistries   Recent Labs Lab 09/22/15 1409 09/23/15 0607  NA 144 144  K 2.7* 2.7*  CL 109 113*  CO2 24 23  GLUCOSE 116* 123*  BUN 10 7  CREATININE 1.05 0.71  CALCIUM 9.0 8.7*  MG  --  2.0  AST  44*  --   ALT 24  --   ALKPHOS 62  --   BILITOT 1.0  --    ------------------------------------------------------------------------------------------------------------------  Cardiac Enzymes  Recent Labs Lab 09/22/15 1409  TROPONINI <0.03   ------------------------------------------------------------------------------------------------------------------  RADIOLOGY:  Dg Chest 2 View  09/22/2015  CLINICAL DATA:  Altered mental status and confusion, progressively worse over the last 10 days. History of hypertension, CHF. EXAM: CHEST  2 VIEW COMPARISON:  None. FINDINGS: There is mild cardiomegaly. Upper mediastinum is widened to approximately 9 cm, possibly related to thyroid goiter, possibly mediastinal lipomatosis. Lungs are clear. No evidence of pneumonia. No pleural effusion or pneumothorax seen. No acute- appearing osseous abnormality. IMPRESSION: 1. Lungs are clear and there is no evidence of acute cardiopulmonary abnormality. 2. Mild cardiomegaly.  No evidence of CHF. 3. Widened mediastinum, possibly due to thyroid goiter, possibly mediastinal lipomatosis. No prior exams available to assess chronicity. If any chest pain, would certainly consider chest CT for further characterization. Electronically Signed   By: Bary Richard M.D.   On: 09/22/2015 16:48   Ct Head Wo Contrast  09/22/2015  CLINICAL DATA:  Altered mental status, confusion EXAM: CT HEAD WITHOUT CONTRAST TECHNIQUE: Contiguous axial images were obtained from the base of the skull through the vertex without intravenous contrast. COMPARISON:  None. FINDINGS: No evidence of parenchymal hemorrhage or extra-axial fluid collection. No mass lesion, mass effect, or midline shift. No CT evidence of acute infarction. Small vessel ischemic changes, particularly in the subcortical right frontal lobe. Mild intracranial atherosclerosis. Global cortical atrophy.  No ventriculomegaly. New complete opacification of the right sphenoid  sinus.  Visualized paranasal sinuses and mastoid air cells otherwise clear. No evidence of calvarial fracture. IMPRESSION: No evidence of acute intracranial abnormality. Atrophy with small vessel ischemic changes. Electronically Signed   By: Charline BillsSriyesh  Krishnan M.D.   On: 09/22/2015 16:10   Ct Angio Chest Aorta W/cm &/or Wo/cm  09/22/2015  CLINICAL DATA:  Acute onset of confusion. Widened mediastinum on chest radiograph. Initial encounter. EXAM: CT ANGIOGRAPHY CHEST WITH CONTRAST TECHNIQUE: Multidetector CT imaging of the chest was performed using the standard protocol during bolus administration of intravenous contrast. Multiplanar CT image reconstructions and MIPs were obtained to evaluate the vascular anatomy. CONTRAST:  75 mL of Isovue 370 IV contrast COMPARISON:  Chest radiograph performed earlier today at 4:05 p.m. FINDINGS: Scattered nodules are seen within the right lung, measuring up to 1.2 cm and 0.7 cm in size (image 33 of 60). Smaller nodules are seen on image 25 of 60, measuring 6 mm and 5 mm in size. Given the multiplicity of nodules, these are likely postinfectious in nature. Minimal bibasilar atelectasis is noted. No pleural effusion or pneumothorax is seen. The mediastinum is unremarkable in appearance. No mediastinal lymphadenopathy is seen. No pericardial effusion is identified. Scattered calcification is seen along the thoracic aorta. No mediastinal lymphadenopathy is appreciated. The great vessels are grossly unremarkable in appearance. Apparent prominence of the superior mediastinum simply reflects fat and normal vasculature. Minimal hypodensities within the thyroid gland are likely benign, given their size. No axillary lymphadenopathy is seen. The visualized portions of the liver and spleen are grossly unremarkable appearance. The gallbladder is within normal limits. The visualized portions of the pancreas and right adrenal gland are within normal limits. Several nodules are seen at the left adrenal  gland, measuring 1.9 cm and 2.0 cm in size. The visualized portions of the kidneys are grossly unremarkable, aside from mild nonspecific perinephric stranding. Mild scattered calcification is seen along the proximal abdominal aorta. No acute osseous abnormalities are identified. Review of the MIP images confirms the above findings. IMPRESSION: 1. Mediastinum is unremarkable in appearance. Prominence superior mediastinum to the reflects fat and normal vasculature. 2. Scattered right lung nodules, measuring up to 1.2 cm, are more likely postinfectious in nature, though PET/CT could be considered if deemed clinically appropriate to exclude underlying malignancy. 3. Minimal bibasilar atelectasis noted. 4. Several left adrenal gland nodules noted, measuring up to 2.0 cm in size. Would correlate with lab values and consider adrenal protocol MRI or CT for further evaluation. Electronically Signed   By: Roanna RaiderJeffery  Chang M.D.   On: 09/22/2015 19:25    EKG:   Orders placed or performed during the hospital encounter of 09/22/15  . ED EKG  . ED EKG  . EKG 12-Lead  . EKG 12-Lead    ASSESSMENT AND PLAN:   Acute encephalopathy, likely combination of dementia and medication.  Patient was on tramadol and Lyrica and Flexeril, which could be causing his confusion he took mistaken doses of these medicines. Per neurology consult no further neuro imaging.  Possible dementia and very hard hearing. Follow and aspiration precaution. Physical therapy evaluation.  Hypokalemia. Give potassium supplement and follow-up level. Magnesium is normal.  Leukocytosis. Unclear ideology, no source of infection. Follow-up CBC.   HTN (hypertension). Continue home hypertension medication.   A-fib. Continue sotalol.    GERD (gastroesophageal reflux disease) - equivalent home dose PPI   I discussed with Dr. Nona DellZelikman, neurologist. All the records are reviewed and case discussed with Care Management/Social Workerr. Management  plans discussed  with the patient's son and he is in agreement.  CODE STATUS: DO NOT RESUSCITATE  TOTAL TIME TAKING CARE OF THIS PATIENT: 39 minutes.  Greater than 50% time was spent on coordination of care and face-to-face counseling.  POSSIBLE D/C IN 2 DAYS, DEPENDING ON CLINICAL CONDITION.   Shaune Pollack M.D on 09/23/2015 at 12:58 PM  Between 7am to 6pm - Pager - [phone]  After 6pm go to [email or web address] - password [email or web address] [facility]  [facility]  Office  [phone]  CC: Primary care physician; Derwood Kaplan, MD

## 2015-09-23 NOTE — Progress Notes (Signed)
Physical Therapy Evaluation Patient Details Name: Daniel CureBilly R Ding MRN: [id number] DOB: 07/19/1931 Today's Date: 09/23/2015   History of Present Illness  Horton ChinBilly Barr is a 82 y.o. male who presents with Increasing confusion for the past 5-6 days, very much acutely worse over the last 2 days.  Neuro consult negative for acute event.  Clinical Impression  Pt presents to PT with confusion and decreased safety with mobility and ambulation.  Pt lives alone with family nearby.  Pt active at baseline and was mowing the lawn and driving as little as a week ago.  Currently pt is able to get in and out of bed and transfer sit<>stand with supervision.  With ambulation, pt has a very quick gait speed and fatigues quickly.  Pt needed supervision with tactile cues for directions around nursing station and to walk a straight path as he tends to stagger/veer off course.  Pt would benefit from acute PT services to address objective findings.    Follow Up Recommendations Home health PT;Supervision/Assistance - 24 hour    Equipment Recommendations  None recommended by PT (has RW)    Recommendations for Other Services       Precautions / Restrictions Precautions Precautions: Fall Precaution Comments: HIGH Restrictions Weight Bearing Restrictions: No      Mobility  Bed Mobility Overal bed mobility: Modified Independent             General bed mobility comments: Supine<>sit with Mod I, able to exit bed to both L and R side.  Transfers Overall transfer level: Needs assistance Equipment used: Rolling walker (2 wheeled) Transfers: Sit to/from Stand Sit to Stand: Supervision         General transfer comment: Sit<>stand from bed in lowest position, using B UE's to push up, good body mechanics  Ambulation/Gait Ambulation/Gait assistance: Supervision Ambulation Distance (Feet): 150 Feet Assistive device: Rolling walker (2 wheeled) Gait Pattern/deviations: Step-through pattern;Staggering  left;Staggering right   Gait velocity interpretation: at or above normal speed for age/gender General Gait Details: Very fast gait speed and as a result fatigues quickly, needing standing rest break for 5-10 seconds after 80'.  Constant cues for directions around nurses station, pt attempting to enter other patient's room.  Stairs            Wheelchair Mobility    Modified Rankin (Stroke Patients Only)       Balance Overall balance assessment: Modified Independent                                           Pertinent Vitals/Pain Pain Assessment: No/denies pain    Home Living Family/patient expects to be discharged to:: Private residence Living Arrangements: Alone Available Help at Discharge: Family;Available PRN/intermittently Type of Home: House Home Access: Stairs to enter Entrance Stairs-Rails: Doctor, general practiceight;Left Entrance Stairs-Number of Steps: 5 Home Layout: One level Home Equipment: Walker - 2 wheels      Prior Function Level of Independence: Independent with assistive device(s)         Comments: Mowing brother's lawn and driving up until a week ago.     Hand Dominance        Extremity/Trunk Assessment   Upper Extremity Assessment: Overall WFL for tasks assessed           Lower Extremity Assessment: Overall WFL for tasks assessed         Communication   Communication: Other (  comment) (Confused and illogical, tangential)  Cognition Arousal/Alertness: Awake/alert (hyper alert) Behavior During Therapy: Restless Overall Cognitive Status: Impaired/Different from baseline Area of Impairment: Orientation;Attention;Following commands;Safety/judgement;Awareness Orientation Level: Disoriented to;Place;Situation;Time Current Attention Level: Divided   Following Commands: Follows one step commands inconsistently Safety/Judgement: Decreased awareness of safety Awareness: Anticipatory        General Comments      Exercises         Assessment/Plan    PT Assessment Patient needs continued PT services  PT Diagnosis Difficulty walking;Altered mental status   PT Problem List Decreased activity tolerance;Decreased cognition;Decreased safety awareness  PT Treatment Interventions Gait training;Stair training;Functional mobility training;Therapeutic activities;Therapeutic exercise;Balance training;Cognitive remediation;Patient/family education   PT Goals (Current goals can be found in the Care Plan section) Acute Rehab PT Goals Patient Stated Goal: None stated PT Goal Formulation: With patient Potential to Achieve Goals: Fair    Frequency Min 2X/week   Barriers to discharge Decreased caregiver support lives alone    Co-evaluation               End of Session Equipment Utilized During Treatment: Gait belt Activity Tolerance: Patient tolerated treatment well Patient left: in bed;with call bell/phone within reach;with bed alarm set;with family/visitor present Nurse Communication: Mobility status         Time: 4098-1191 PT Time Calculation (min) (ACUTE ONLY): 32 min   Charges:   PT Evaluation $PT Eval Low Complexity: 1 Procedure     PT G Codes:        Itzell Bendavid A Sawsan Riggio, PT 09/23/2015, 3:49 PM

## 2015-09-23 NOTE — Consult Note (Signed)
CC: AMS  HPI: Daniel Barr is an 82 y.o. male who presents with Increasing confusion for the past 5-6 days, very much acutely worse over the last 2 days.  Pt is apparently lives alone and self sufficient. Patient is unable to contribute to his own history of present illness due to his confusion. Pt was fond to have have a very mild leukocytosis with a white count of 13.8, but a normal differential. No apparent infection found on imaging or lab workup.  CTH no acute abnormalities found.   Past Medical History  Diagnosis Date  . Hard of hearing   . Hypertension   . Nerve pain   . CHF (congestive heart failure) (HCC)   . GERD (gastroesophageal reflux disease)   . HLD (hyperlipidemia)   . A-fib [facility])     Past Surgical History  Procedure Laterality Date  . Colonoscopy    . Joint replacement      Family History  Problem Relation Age of Onset  . Hypertension    . Stroke    . Cancer      Social History:  reports that he has quit smoking. He does not have any smokeless tobacco history on file. He reports that he drinks alcohol. He reports that he does not use illicit drugs.  No Known Allergies  Medications: I have reviewed the patient's current medications.  ROS: Unable to obtain due to confusion  Physical Examination: Blood pressure 175/79, pulse 83, temperature 98.8 F (37.1 C), temperature source Oral, resp. rate 20, height  (1.803 m), weight 102.604 kg (226 lb 3.2 oz), SpO2 95 %.    Neurological Examination Mental Status: Alert to name only. Cranial Nerves: II: Discs flat bilaterally; Visual fields grossly normal, pupils equal, round, reactive to light and accommodation III,IV, VI: ptosis not present, extra-ocular motions intact bilaterally V,VII: smile symmetric, facial light touch sensation normal bilaterally VIII: hearing normal bilaterally IX,X: gag reflex present XI: bilateral shoulder shrug XII: midline tongue extension Motor: Right : Upper extremity    4/5    Left:     Upper extremity   4/5  Lower extremity   4/5     Lower extremity   4/5 Tone and bulk:normal tone throughout; no atrophy noted Sensory: Pinprick and light touch intact throughout, bilaterally Deep Tendon Reflexes: 1+ and symmetric throughout Plantars: Right: downgoing   Left: downgoing Cerebellar: normal finger-to-nose, normal rapid alternating movements and normal heel-to-shin test Gait: not tested       Laboratory Studies:   Basic Metabolic Panel:  Recent Labs Lab 09/22/15 1409 09/23/15 0607  NA 144 144  K 2.7* 2.7*  CL 109 113*  CO2 24 23  GLUCOSE 116* 123*  BUN 10 7  CREATININE 1.05 0.71  CALCIUM 9.0 8.7*  MG  --  2.0    Liver Function Tests:  Recent Labs Lab 09/22/15 1409  AST 44*  ALT 24  ALKPHOS 62  BILITOT 1.0  PROT 7.3  ALBUMIN 4.1   No results for input(s): LIPASE, AMYLASE in the last 168 hours. No results for input(s): AMMONIA in the last 168 hours.  CBC:  Recent Labs Lab 09/22/15 1409 09/23/15 0607  WBC 13.8* 14.0*  NEUTROABS 10.1*  --   HGB 15.7 15.7  HCT 46.5 47.4  MCV 88.6 88.2  PLT 331 295    Cardiac Enzymes:  Recent Labs Lab 09/22/15 1409  TROPONINI <0.03    BNP: Invalid input(s): POCBNP  CBG: No results for input(s): GLUCAP in the  last 168 hours.  Microbiology: No results found for this or any previous visit.  Coagulation Studies: No results for input(s): LABPROT, INR in the last 72 hours.  Urinalysis:  Recent Labs Lab 09/22/15 1531  COLORURINE YELLOW*  LABSPEC 1.021  PHURINE 5.0  GLUCOSEU NEGATIVE  HGBUR 1+*  BILIRUBINUR NEGATIVE  KETONESUR TRACE*  PROTEINUR 100*  NITRITE NEGATIVE  LEUKOCYTESUR NEGATIVE    Lipid Panel:  No results found for: CHOL, TRIG, HDL, CHOLHDL, VLDL, LDLCALC  HgbA1C: No results found for: HGBA1C  Urine Drug Screen:     Component Value Date/Time   LABOPIA NONE DETECTED 09/22/2015 1630   COCAINSCRNUR NONE DETECTED 09/22/2015 1630   LABBENZ NONE DETECTED  09/22/2015 1630   AMPHETMU NONE DETECTED 09/22/2015 1630   THCU NONE DETECTED 09/22/2015 1630   LABBARB NONE DETECTED 09/22/2015 1630    Alcohol Level:  Recent Labs Lab 09/22/15 1409  ETH <5    Imaging: Dg Chest 2 View  09/22/2015  CLINICAL DATA:  Altered mental status and confusion, progressively worse over the last 10 days. History of hypertension, CHF. EXAM: CHEST  2 VIEW COMPARISON:  None. FINDINGS: There is mild cardiomegaly. Upper mediastinum is widened to approximately 9 cm, possibly related to thyroid goiter, possibly mediastinal lipomatosis. Lungs are clear. No evidence of pneumonia. No pleural effusion or pneumothorax seen. No acute- appearing osseous abnormality. IMPRESSION: 1. Lungs are clear and there is no evidence of acute cardiopulmonary abnormality. 2. Mild cardiomegaly.  No evidence of CHF. 3. Widened mediastinum, possibly due to thyroid goiter, possibly mediastinal lipomatosis. No prior exams available to assess chronicity. If any chest pain, would certainly consider chest CT for further characterization. Electronically Signed   By: Bary RichardStan  Maynard M.D.   On: 09/22/2015 16:48   Ct Head Wo Contrast  09/22/2015  CLINICAL DATA:  Altered mental status, confusion EXAM: CT HEAD WITHOUT CONTRAST TECHNIQUE: Contiguous axial images were obtained from the base of the skull through the vertex without intravenous contrast. COMPARISON:  None. FINDINGS: No evidence of parenchymal hemorrhage or extra-axial fluid collection. No mass lesion, mass effect, or midline shift. No CT evidence of acute infarction. Small vessel ischemic changes, particularly in the subcortical right frontal lobe. Mild intracranial atherosclerosis. Global cortical atrophy.  No ventriculomegaly. New complete opacification of the right sphenoid sinus. Visualized paranasal sinuses and mastoid air cells otherwise clear. No evidence of calvarial fracture. IMPRESSION: No evidence of acute intracranial abnormality. Atrophy with  small vessel ischemic changes. Electronically Signed   By: Charline BillsSriyesh  Krishnan M.D.   On: 09/22/2015 16:10   Ct Angio Chest Aorta W/cm &/or Wo/cm  09/22/2015  CLINICAL DATA:  Acute onset of confusion. Widened mediastinum on chest radiograph. Initial encounter. EXAM: CT ANGIOGRAPHY CHEST WITH CONTRAST TECHNIQUE: Multidetector CT imaging of the chest was performed using the standard protocol during bolus administration of intravenous contrast. Multiplanar CT image reconstructions and MIPs were obtained to evaluate the vascular anatomy. CONTRAST:  75 mL of Isovue 370 IV contrast COMPARISON:  Chest radiograph performed earlier today at 4:05 p.m. FINDINGS: Scattered nodules are seen within the right lung, measuring up to 1.2 cm and 0.7 cm in size (image 33 of 60). Smaller nodules are seen on image 25 of 60, measuring 6 mm and 5 mm in size. Given the multiplicity of nodules, these are likely postinfectious in nature. Minimal bibasilar atelectasis is noted. No pleural effusion or pneumothorax is seen. The mediastinum is unremarkable in appearance. No mediastinal lymphadenopathy is seen. No pericardial effusion is identified. Scattered calcification  is seen along the thoracic aorta. No mediastinal lymphadenopathy is appreciated. The great vessels are grossly unremarkable in appearance. Apparent prominence of the superior mediastinum simply reflects fat and normal vasculature. Minimal hypodensities within the thyroid gland are likely benign, given their size. No axillary lymphadenopathy is seen. The visualized portions of the liver and spleen are grossly unremarkable appearance. The gallbladder is within normal limits. The visualized portions of the pancreas and right adrenal gland are within normal limits. Several nodules are seen at the left adrenal gland, measuring 1.9 cm and 2.0 cm in size. The visualized portions of the kidneys are grossly unremarkable, aside from mild nonspecific perinephric stranding. Mild scattered  calcification is seen along the proximal abdominal aorta. No acute osseous abnormalities are identified. Review of the MIP images confirms the above findings. IMPRESSION: 1. Mediastinum is unremarkable in appearance. Prominence superior mediastinum to the reflects fat and normal vasculature. 2. Scattered right lung nodules, measuring up to 1.2 cm, are more likely postinfectious in nature, though PET/CT could be considered if deemed clinically appropriate to exclude underlying malignancy. 3. Minimal bibasilar atelectasis noted. 4. Several left adrenal gland nodules noted, measuring up to 2.0 cm in size. Would correlate with lab values and consider adrenal protocol MRI or CT for further evaluation. Electronically Signed   By: Roanna Raider M.D.   On: 09/22/2015 19:25     Assessment/Plan:  82 y.o. male who presents with Increasing confusion for the past 5-6 days, very much acutely worse over the last 2 days.  Pt is apparently lives alone and self sufficient. Patient is unable to contribute to his own history of present illness due to his confusion. Pt was fond to have have a very mild leukocytosis with a white count of 13.8, but a normal differential. No apparent infection found on imaging or lab workup.  CTH no acute abnormalities found.   Pt does not have any focal neurological deficits. He is confused.  WBC at 14 with likely dehydration and pt does have hypokelemia Con't hydration  No further testing from neuro stand point  [facility], Daniel Barr  09/23/2015, 12:57 PM

## 2015-09-23 NOTE — Progress Notes (Signed)
MEDICATION RELATED CONSULT NOTE - INITIAL   Pharmacy Consult for Electrolytes  Indication: Potassium replacement   No Known Allergies  Patient Measurements: Height: 5\' 11"  (180.3 cm) Weight: 226 lb 3.2 oz (102.604 kg) IBW/kg (Calculated) : 75.3 Adjusted Body Weight:   Vital Signs: Temp: 98.8 F (37.1 C) (05/21 0602) Temp Source: Oral (05/21 0602) BP: 175/79 mmHg (05/21 0602) Pulse Rate: 83 (05/21 0602) Intake/Output from previous day: 05/20 0701 - 05/21 0700 In: 146.7 [I.V.:146.7] Out: 1250 [Urine:1250] Intake/Output from this shift: Total I/O In: 903.2 [P.O.:120; I.V.:783.2] Out: 350 [Urine:350]  Labs:  Recent Labs  09/22/15 1409 09/23/15 0607  WBC 13.8* 14.0*  HGB 15.7 15.7  HCT 46.5 47.4  PLT 331 295  CREATININE 1.05 0.71  MG  --  2.0  ALBUMIN 4.1  --   PROT 7.3  --   AST 44*  --   ALT 24  --   ALKPHOS 62  --   BILITOT 1.0  --    Estimated Creatinine Clearance: 88.3 mL/min (by C-G formula based on Cr of 0.71).   Microbiology: No results found for this or any previous visit (from the past 720 hour(s)).  Medical History: Past Medical History  Diagnosis Date  . Hard of hearing   . Hypertension   . Nerve pain   . CHF (congestive heart failure) (HCC)   . GERD (gastroesophageal reflux disease)   . HLD (hyperlipidemia)   . A-fib (HCC)     Medications:  Scheduled:  . amLODipine  10 mg Oral Daily  . aspirin EC  81 mg Oral Daily  . enalapril  20 mg Oral BID  . enoxaparin (LOVENOX) injection  40 mg Subcutaneous Q24H  . pantoprazole  40 mg Oral Daily  . potassium chloride SA  20 mEq Oral Daily  . potassium chloride  40 mEq Oral BID  . sodium chloride flush  3 mL Intravenous Q12H  . sotalol  80 mg Oral BID  . torsemide  20 mg Oral Daily    Assessment: 5/21 :  K = 2.7  Goal of Therapy:  K :  3.5 - 5.1   Plan:  KCl 20 mEq PO X 1 given on 5/20. KCl 40 mEq PO BID X 2 doses  PLUS 20 mEq PO daily on 5/21 to make total dose of 100 mEq on  5/21. KCl 20 mEq PO daily starting on 5/22.  Pharmacy to follow and manage thereafter.   BMP on 5/22 with AM labs.   Daniel Barr D 09/23/2015,10:35 AM

## 2015-09-23 NOTE — Progress Notes (Signed)
Dr Imogene Burnhen notified of critical K+ of 2.7

## 2015-09-24 ENCOUNTER — Inpatient Hospital Stay: Payer: Medicare Other

## 2015-09-24 LAB — BASIC METABOLIC PANEL
ANION GAP: 7 (ref 5–15)
BUN: 10 mg/dL (ref 6–20)
CHLORIDE: 115 mmol/L — AB (ref 101–111)
CO2: 22 mmol/L (ref 22–32)
Calcium: 8.9 mg/dL (ref 8.9–10.3)
Creatinine, Ser: 0.93 mg/dL (ref 0.61–1.24)
GFR calc non Af Amer: >60 mL/min (ref >60)
Glucose, Bld: 104 mg/dL — ABNORMAL HIGH (ref 65–99)
POTASSIUM: 3.3 mmol/L — AB (ref 3.5–5.1)
SODIUM: 144 mmol/L (ref 135–145)

## 2015-09-24 LAB — CBC
HEMATOCRIT: 51.3 % (ref 40.0–52.0)
HEMOGLOBIN: 17 g/dL (ref 13.0–18.0)
MCH: 29.5 pg (ref 26.0–34.0)
MCHC: 33.2 g/dL (ref 32.0–36.0)
MCV: 88.7 fL (ref 80.0–100.0)
Platelets: 311 10*3/uL (ref 150–440)
RBC: 5.78 MIL/uL (ref 4.40–5.90)
RDW: 15.7 % — ABNORMAL HIGH (ref 11.5–14.5)
WBC: 17.4 10*3/uL — AB (ref 3.8–10.6)

## 2015-09-24 LAB — URINE CULTURE

## 2015-09-24 MED ORDER — POTASSIUM CHLORIDE CRYS ER 20 MEQ PO TBCR
20.0000 meq | EXTENDED_RELEASE_TABLET | Freq: Once | ORAL | Status: AC
  Administered 2015-09-24: 20 meq via ORAL
  Filled 2015-09-24: qty 1

## 2015-09-24 MED ORDER — TAMSULOSIN HCL 0.4 MG PO CAPS
0.8000 mg | ORAL_CAPSULE | Freq: Every day | ORAL | Status: DC
  Administered 2015-09-25: 0.8 mg via ORAL
  Filled 2015-09-24: qty 2

## 2015-09-24 NOTE — Clinical Social Work Note (Signed)
CSW has spoken to MD and the order for CXR was placed specifically asking to rule out TB. Radiology did not address this. CSW has contacted Dr. Imogene Burnhen and he will follow up with radiology. CSW has informed Dr. Imogene Burnhen if we do not get a CXR where TB has been ruled out, we will require the PPD for ALF placement. York SpanielMonica Arabelle Bollig MSW,LCSW [phone]

## 2015-09-24 NOTE — Progress Notes (Signed)
09/24/2015 1:27 PM  NT/sitter informed me pt BP 149/94.  Notified Dr. Imogene Burnhen via page.  Will await further orders or instructions. Bradly Chrisougherty, Sunday Klos E, RN

## 2015-09-24 NOTE — Progress Notes (Signed)
[facility] [place] [facility]   PATIENT NAME: Daniel Barr    MR#:  [id number]  DATE OF BIRTH:  January 15, 1932  SUBJECTIVE:  CHIEF COMPLAINT:   Chief Complaint  Patient presents with  . Altered Mental Status   The patient is very hard of hearing and demented. REVIEW OF SYSTEMS:  Demented, unable to get ROS.   DRUG ALLERGIES:  No Known Allergies  VITALS:  Blood pressure 149/94, pulse 82, temperature 97.6 F (36.4 C), temperature source Oral, resp. rate 22, height 5\' 11"  (1.803 m), weight 102.604 kg (226 lb 3.2 oz), SpO2 95 %.  PHYSICAL EXAMINATION:  GENERAL:  82 y.o.-year-old patient lying in the bed with no acute distress.  EYES: Pupils equal, round, reactive to light and accommodation. No scleral icterus. Extraocular muscles intact.  HEENT: Head atraumatic, normocephalic. Oropharynx and nasopharynx clear.  NECK:  Supple, no jugular venous distention. No thyroid enlargement, no tenderness.  LUNGS: Normal breath sounds bilaterally, no wheezing, rales,rhonchi or crepitation. No use of accessory muscles of respiration.  CARDIOVASCULAR: S1, S2 normal. No murmurs, rubs, or gallops.  ABDOMEN: Soft, nontender, nondistended. Bowel sounds present. No organomegaly or mass.  EXTREMITIES: No pedal edema, cyanosis, or clubbing.  NEUROLOGIC: Cranial nerves II through XII are intact. Muscle strength 3/5 in all extremities. Sensation intact. Gait not checked.  PSYCHIATRIC: The patient is Demented.Marland Kitchen  SKIN: No obvious rash, lesion, or ulcer.    LABORATORY PANEL:   CBC  Recent Labs Lab 09/24/15 0423  WBC 17.4*  HGB 17.0  HCT 51.3  PLT 311   ------------------------------------------------------------------------------------------------------------------  Chemistries   Recent Labs Lab 09/22/15 1409 09/23/15 0607 09/24/15 0423  NA 144 144 144  K 2.7* 2.7* 3.3*  CL 109 113* 115*  CO2 24 23 22   GLUCOSE 116* 123* 104*  BUN 10 7 10   CREATININE 1.05  0.71 0.93  CALCIUM 9.0 8.7* 8.9  MG  --  2.0  --   AST 44*  --   --   ALT 24  --   --   ALKPHOS 62  --   --   BILITOT 1.0  --   --    ------------------------------------------------------------------------------------------------------------------  Cardiac Enzymes  Recent Labs Lab 09/22/15 1409  TROPONINI <0.03   ------------------------------------------------------------------------------------------------------------------  RADIOLOGY:  Dg Chest 2 View  09/24/2015  ADDENDUM REPORT: 09/24/2015 14:51 ADDENDUM: Additional history provided:  Clinical concern for TB. No suspicious features for TB identified. Electronically Signed   By: Ulyses Southward M.D.   On: 09/24/2015 14:51  09/24/2015  CLINICAL DATA:  Confusion, weakness, dementia, hypertension, CHF, atrial fibrillation, former smoker EXAM: CHEST  2 VIEW COMPARISON:  09/22/2015 radiographic and CT exams FINDINGS: Upper normal heart size. Atherosclerotic calcification aorta. Mediastinal contours and pulmonary vascularity normal. Accentuation of interstitial markings similar to previous exam. 5 mm diameter RIGHT mid lung nodule as noted on prior CT. Minimal atelectasis at LEFT base. No acute infiltrate, pleural effusion or pneumothorax. Diffuse osseous demineralization. IMPRESSION: Chronic accentuation of interstitial markings with subsegmental atelectasis at LEFT base. 5 mm RIGHT mid lung nodule as noted on prior CT. Electronically Signed: By: Ulyses Southward M.D. On: 09/24/2015 13:11   Dg Chest 2 View  09/22/2015  CLINICAL DATA:  Altered mental status and confusion, progressively worse over the last 10 days. History of hypertension, CHF. EXAM: CHEST  2 VIEW COMPARISON:  None. FINDINGS: There is mild cardiomegaly. Upper mediastinum is widened to approximately 9 cm, possibly related to thyroid goiter, possibly mediastinal lipomatosis. Lungs  are clear. No evidence of pneumonia. No pleural effusion or pneumothorax seen. No acute- appearing osseous  abnormality. IMPRESSION: 1. Lungs are clear and there is no evidence of acute cardiopulmonary abnormality. 2. Mild cardiomegaly.  No evidence of CHF. 3. Widened mediastinum, possibly due to thyroid goiter, possibly mediastinal lipomatosis. No prior exams available to assess chronicity. If any chest pain, would certainly consider chest CT for further characterization. Electronically Signed   By: Bary RichardStan  Maynard M.D.   On: 09/22/2015 16:48   Ct Angio Chest Aorta W/cm &/or Wo/cm  09/22/2015  CLINICAL DATA:  Acute onset of confusion. Widened mediastinum on chest radiograph. Initial encounter. EXAM: CT ANGIOGRAPHY CHEST WITH CONTRAST TECHNIQUE: Multidetector CT imaging of the chest was performed using the standard protocol during bolus administration of intravenous contrast. Multiplanar CT image reconstructions and MIPs were obtained to evaluate the vascular anatomy. CONTRAST:  75 mL of Isovue 370 IV contrast COMPARISON:  Chest radiograph performed earlier today at 4:05 p.m. FINDINGS: Scattered nodules are seen within the right lung, measuring up to 1.2 cm and 0.7 cm in size (image 33 of 60). Smaller nodules are seen on image 25 of 60, measuring 6 mm and 5 mm in size. Given the multiplicity of nodules, these are likely postinfectious in nature. Minimal bibasilar atelectasis is noted. No pleural effusion or pneumothorax is seen. The mediastinum is unremarkable in appearance. No mediastinal lymphadenopathy is seen. No pericardial effusion is identified. Scattered calcification is seen along the thoracic aorta. No mediastinal lymphadenopathy is appreciated. The great vessels are grossly unremarkable in appearance. Apparent prominence of the superior mediastinum simply reflects fat and normal vasculature. Minimal hypodensities within the thyroid gland are likely benign, given their size. No axillary lymphadenopathy is seen. The visualized portions of the liver and spleen are grossly unremarkable appearance. The gallbladder  is within normal limits. The visualized portions of the pancreas and right adrenal gland are within normal limits. Several nodules are seen at the left adrenal gland, measuring 1.9 cm and 2.0 cm in size. The visualized portions of the kidneys are grossly unremarkable, aside from mild nonspecific perinephric stranding. Mild scattered calcification is seen along the proximal abdominal aorta. No acute osseous abnormalities are identified. Review of the MIP images confirms the above findings. IMPRESSION: 1. Mediastinum is unremarkable in appearance. Prominence superior mediastinum to the reflects fat and normal vasculature. 2. Scattered right lung nodules, measuring up to 1.2 cm, are more likely postinfectious in nature, though PET/CT could be considered if deemed clinically appropriate to exclude underlying malignancy. 3. Minimal bibasilar atelectasis noted. 4. Several left adrenal gland nodules noted, measuring up to 2.0 cm in size. Would correlate with lab values and consider adrenal protocol MRI or CT for further evaluation. Electronically Signed   By: Roanna RaiderJeffery  Chang M.D.   On: 09/22/2015 19:25    EKG:   Orders placed or performed during the hospital encounter of 09/22/15  . ED EKG  . ED EKG  . EKG 12-Lead  . EKG 12-Lead    ASSESSMENT AND PLAN:   Acute encephalopathy, likely combination of dementia and medication.  Patient was on tramadol and Lyrica and Flexeril, which could be causing his confusion he took mistaken doses of these medicines. Per neurology consult, no further neuro imaging.  Possible dementia and very hard hearing. Follow and aspiration precaution. Physical therapy evaluation.  Hypokalemia. Give potassium supplement and follow-up level. Magnesium is normal.  Leukocytosis. Unclear ideology, no source of infection. Follow-up CBC.   HTN (hypertension). Continue home hypertension medication.  A-fib. Continue sotalol.    GERD (gastroesophageal reflux disease) - equivalent  home dose PPI  ALF placement. TB is ruled out per CXR report. All the records are reviewed and case discussed with Care Management/Social Workerr. Management plans discussed with the patient's son and he is in agreement.  CODE STATUS: DO NOT RESUSCITATE  TOTAL TIME TAKING CARE OF THIS PATIENT: 35 minutes.  Greater than 50% time was spent on coordination of care and face-to-face counseling.  POSSIBLE D/C IN 1-2 DAYS, DEPENDING ON CLINICAL CONDITION.   Shaune Pollack M.D on 09/24/2015 at 4:20 PM  Between 7am to 6pm - Pager - [phone]  After 6pm go to [email or web address] - password [email or web address] [facility]  [facility]  Office  [phone]  CC: Primary care physician; Derwood Kaplan, MD

## 2015-09-24 NOTE — Clinical Social Work Note (Signed)
Clinical Social Work Assessment  Patient Details  Name: Daniel Barr MRN: [id number] Date of Birth: 12/14/1931  Date of referral:  09/24/15               Reason for consult:  Facility Placement                Permission sought to share information with:    Permission granted to share information::   (patient currently confused )  Name::        Agency::     Relationship::     Contact Information:     Housing/Transportation Living arrangements for the past 2 months:  Single Family Home Source of Information:  Adult Children Patient Interpreter Needed:  None Criminal Activity/Legal Involvement Pertinent to Current Situation/Hospitalization:  No - Comment as needed Significant Relationships:  Adult Children Lives with:  Self Do you feel safe going back to the place where you live?    Need for family participation in patient care:  Yes (Comment)  Care giving concerns:  Patient resides at home alone.   Social Worker assessment / plan:  Patient's mental status has been consistently declining and patient's son: Daniel Barr: [phone] brought patient to emergency room after finding pills all over patient's home and finding him more confused than normal. Patient's son stated that he had been looking at placing patient at [facility]. Patient's son stated he has not gone over any of the financials but is expecting it to be about $4000 per month. Patient's son stated he expects patient will be able to pay for about 12 months and then will have to convert to medicaid. Patient's son stated that he has been speaking with a Nurse, [facility]. Patient's son aware that MD has stated patient may be ready for discharge tomorrow. CSW contacted Shanda BumpsJessica at [facility] and they are going to come up and evaluate patient today at 3pm to determine if they can take patient and if patient needs to go to their ALF or their memory care. Shanda BumpsJessica was made aware that patient would be ready for  discharge tomorrow. Shanda BumpsJessica stated that patient could either need a PPD or a chest xray ruling out TB.   Employment status:  Retired Database administratornsurance information:  Managed Medicare PT Recommendations:  Home with Home Health Information / Referral to community resources:     Patient/Family's Response to care:  Patient's son expressed appreciation for CSW assistance.  Patient/Family's Understanding of and Emotional Response to Diagnosis, Current Treatment, and Prognosis:  Patient's son is aware patient cannot stay by himself any longer.  Emotional Assessment Appearance:  Appears stated age Attitude/Demeanor/Rapport:   (pleasantly confused) Affect (typically observed):    Orientation:  Oriented to Self Alcohol / Substance use:  Not Applicable Psych involvement (Current and /or in the community):  No (Comment)  Discharge Needs  Concerns to be addressed:  Care Coordination Readmission within the last 30 days:  No Current discharge risk:  None Barriers to Discharge:  No Barriers Identified   York SpanielMonica Mats Jeanlouis, LCSW 09/24/2015, 1:12 PM

## 2015-09-24 NOTE — Care Management (Signed)
Chest X-ray did not specify TB rule in/out. CSW is paging MD to correct. PPD may still be needed for ALF placement however CSW is working with MD. RNCM will follow along with CSW for progression of this patient. 

## 2015-09-24 NOTE — NC FL2 (Signed)
[place] MEDICAID FL2 LEVEL OF CARE SCREENING TOOL     IDENTIFICATION  Patient Name: Daniel Barr Birthdate: 26-Apr-1932 Sex: male Admission Date (Current Location): 09/22/2015  [facility] and [place] Number:  Chiropodist and Address:  [facility], [address] [place], [place] [address]      Provider Number: [id number]  Attending Physician Name and Address:  Shaune Pollack, MD  Relative Name and Phone Number:       Current Level of Care: Hospital Recommended Level of Care: Skilled Nursing Facility Prior Approval Number:    Date Approved/Denied:   PASRR Number:    Discharge Plan:  (ALF)    Current Diagnoses: Patient Active Problem List   Diagnosis Date Noted  . Acute encephalopathy 09/22/2015  . HTN (hypertension) 09/22/2015  . A-fib (HCC) 09/22/2015  . GERD (gastroesophageal reflux disease) 09/22/2015    Orientation RESPIRATION BLADDER Height & Weight     Self, Time, Situation, Place  Normal Continent Weight: 226 lb 3.2 oz (102.604 kg) Height:   (180.3 cm)  BEHAVIORAL SYMPTOMS/MOOD NEUROLOGICAL BOWEL NUTRITION STATUS   (Tends to try and get up out of bed and is very unsteady)  (none) Continent Diet (heart healthy)  AMBULATORY STATUS COMMUNICATION OF NEEDS Skin   Supervision Verbally Normal                       Personal Care Assistance Level of Assistance  Bathing, Feeding, Dressing Bathing Assistance: Limited assistance Feeding assistance: Limited assistance Dressing Assistance: Limited assistance     Functional Limitations Info  Hearing (has hearing aides)   Hearing Info: Impaired      SPECIAL CARE FACTORS FREQUENCY  PT (By licensed PT) (will have home health ordered at discharge from hospital)                    Contractures Contractures Info: Not present    Additional Factors Info  Code Status Code Status Info: DNR             Current Medications (09/24/2015):  This is the current hospital  active medication list Current Facility-Administered Medications  Medication Dose Route Frequency Provider Last Rate Last Dose  . acetaminophen (TYLENOL) tablet 650 mg  650 mg Oral Q6H PRN Oralia Manis, MD   650 mg at 09/23/15 0157   Or  . acetaminophen (TYLENOL) suppository 650 mg  650 mg Rectal Q6H PRN Oralia Manis, MD      . amLODipine (NORVASC) tablet 10 mg  10 mg Oral Daily Shaune Pollack, MD   10 mg at 09/24/15 1000  . aspirin EC tablet 81 mg  81 mg Oral Daily Shaune Pollack, MD   81 mg at 09/24/15 1000  . enalapril (VASOTEC) tablet 20 mg  20 mg Oral BID Shaune Pollack, MD   20 mg at 09/24/15 1000  . enoxaparin (LOVENOX) injection 40 mg  40 mg Subcutaneous Q24H Oralia Manis, MD   40 mg at 09/23/15 2141  . hydrALAZINE (APRESOLINE) injection 10 mg  10 mg Intravenous Q4H PRN Oralia Manis, MD   10 mg at 09/23/15 8413  . ondansetron (ZOFRAN) tablet 4 mg  4 mg Oral Q6H PRN Oralia Manis, MD       Or  . ondansetron [facility]) injection 4 mg  4 mg Intravenous Q6H PRN Oralia Manis, MD      . pantoprazole (PROTONIX) EC tablet 40 mg  40 mg Oral Daily Oralia Manis, MD  40 mg at 09/24/15 1000  . potassium chloride SA (K-DUR,KLOR-CON) CR tablet 20 mEq  20 mEq Oral Daily Shaune PollackQing Allenmichael Mcpartlin, MD   20 mEq at 09/24/15 1000  . sodium chloride flush (NS) 0.9 % injection 3 mL  3 mL Intravenous Q12H Oralia Manisavid Willis, MD   3 mL at 09/24/15 1000  . sotalol (BETAPACE) tablet 80 mg  80 mg Oral BID Oralia Manisavid Willis, MD   80 mg at 09/24/15 1000  . torsemide (DEMADEX) tablet 20 mg  20 mg Oral Daily Shaune PollackQing Deretha Ertle, MD   20 mg at 09/24/15 1000  . traMADol (ULTRAM) tablet 50-100 mg  50-100 mg Oral Q6H PRN Shaune PollackQing Reynaldo Rossman, MD   50 mg at 09/23/15 1747     Discharge Medications: Please see discharge summary for a list of discharge medications.  Relevant Imaging Results:  Relevant Lab Results:   Additional Information SS: [id number]  York SpanielMonica Marra, LCSW

## 2015-09-24 NOTE — Care Management (Signed)
Met briefly with patient who seemed to be hard of hearing however his son stated that he is not making safe decisions and son "lives one hour away". "He cannot go home and I've talked to him about that". Son states he has been working with Janett Billow at [facility] for long-term care. I explained long term care is often private pay and he said "he has money to pay for it". He also said that he has told his father that he will need long-term placement. CSW updated. List of home health agencies and long-term care in the home left with patient's son. RNCM will continue to follow along with CSW.

## 2015-09-24 NOTE — Progress Notes (Signed)
MEDICATION RELATED CONSULT NOTE -follow up  Pharmacy Consult for Electrolytes  Indication: Potassium replacement   No Known Allergies  Patient Measurements: Height: 5\' 11"  (180.3 cm) Weight: 226 lb 3.2 oz (102.604 kg) IBW/kg (Calculated) : 75.3 Adjusted Body Weight:   Vital Signs: Temp: 98 F (36.7 C) (05/22 0445) Temp Source: Oral (05/21 2310) BP: 176/89 mmHg (05/22 0445) Pulse Rate: 64 (05/22 0445) Intake/Output from previous day: 05/21 0701 - 05/22 0700 In: 1023.2 [P.O.:240; I.V.:783.2] Out: 925 [Urine:925] Intake/Output from this shift:    Labs:  Recent Labs  09/22/15 1409 09/23/15 0607 09/24/15 0423  WBC 13.8* 14.0* 17.4*  HGB 15.7 15.7 17.0  HCT 46.5 47.4 51.3  PLT 331 295 311  CREATININE 1.05 0.71 0.93  MG  --  2.0  --   ALBUMIN 4.1  --   --   PROT 7.3  --   --   AST 44*  --   --   ALT 24  --   --   ALKPHOS 62  --   --   BILITOT 1.0  --   --    Lab Results  Component Value Date   K 3.3* 09/24/2015   Estimated Creatinine Clearance: 76 mL/min (by C-G formula based on Cr of 0.93).   Medical History: Past Medical History  Diagnosis Date  . Hard of hearing   . Hypertension   . Nerve pain   . CHF (congestive heart failure) (HCC)   . GERD (gastroesophageal reflux disease)   . HLD (hyperlipidemia)   . A-fib (HCC)     Medications:  Scheduled:  . amLODipine  10 mg Oral Daily  . aspirin EC  81 mg Oral Daily  . enalapril  20 mg Oral BID  . enoxaparin (LOVENOX) injection  40 mg Subcutaneous Q24H  . pantoprazole  40 mg Oral Daily  . potassium chloride SA  20 mEq Oral Daily  . potassium chloride  20 mEq Oral Once  . sodium chloride flush  3 mL Intravenous Q12H  . sotalol  80 mg Oral BID  . torsemide  20 mg Oral Daily    Assessment: 82 yo M  Acute encephalopathy - unclear etiology at this time.  5/21 :  K = 2.7  Goal of Therapy:  K :  3.5 - 5.1   Plan:  KCl 20 mEq PO X 1 given on 5/20. KCl 40 mEq PO BID X 2 doses  PLUS 20 mEq PO daily on  5/21 to make total dose of 100 mEq on 5/21. KCl 20 mEq PO daily starting on 5/22.  Pharmacy to follow and manage thereafter.   5/22: K= 3.3. Will give KCL po 20meq x 1 dose and continue current orders for KCL po 20meq daily. BMP with AM labs.   Christia Coaxum A 09/24/2015,7:38 AM

## 2015-09-25 LAB — CBC
HEMATOCRIT: 49.7 % (ref 40.0–52.0)
HEMOGLOBIN: 16.8 g/dL (ref 13.0–18.0)
MCH: 30.4 pg (ref 26.0–34.0)
MCHC: 33.8 g/dL (ref 32.0–36.0)
MCV: 90 fL (ref 80.0–100.0)
Platelets: 286 10*3/uL (ref 150–440)
RBC: 5.52 MIL/uL (ref 4.40–5.90)
RDW: 16 % — ABNORMAL HIGH (ref 11.5–14.5)
WBC: 15.9 10*3/uL — ABNORMAL HIGH (ref 3.8–10.6)

## 2015-09-25 LAB — BASIC METABOLIC PANEL
ANION GAP: 9 (ref 5–15)
BUN: 19 mg/dL (ref 6–20)
CHLORIDE: 114 mmol/L — AB (ref 101–111)
CO2: 24 mmol/L (ref 22–32)
Calcium: 9 mg/dL (ref 8.9–10.3)
Creatinine, Ser: 1.12 mg/dL (ref 0.61–1.24)
GFR calc non Af Amer: 60 mL/min — ABNORMAL LOW (ref >60)
Glucose, Bld: 97 mg/dL (ref 65–99)
Potassium: 3.2 mmol/L — ABNORMAL LOW (ref 3.5–5.1)
Sodium: 147 mmol/L — ABNORMAL HIGH (ref 135–145)

## 2015-09-25 MED ORDER — ASPIRIN EC 81 MG PO TBEC: 81.0000 mg | DELAYED_RELEASE_TABLET | Freq: Every day | ORAL | Status: AC

## 2015-09-25 MED ORDER — TORSEMIDE 20 MG PO TABS: 20.0000 mg | ORAL_TABLET | Freq: Every day | ORAL | Status: AC

## 2015-09-25 MED ORDER — OMEPRAZOLE 20 MG PO CPDR: 20.0000 mg | DELAYED_RELEASE_CAPSULE | Freq: Two times a day (BID) | ORAL | Status: AC

## 2015-09-25 MED ORDER — SOTALOL HCL 80 MG PO TABS: 80.0000 mg | ORAL_TABLET | Freq: Two times a day (BID) | ORAL | Status: AC

## 2015-09-25 MED ORDER — POTASSIUM CHLORIDE CRYS ER 20 MEQ PO TBCR
40.0000 meq | EXTENDED_RELEASE_TABLET | Freq: Every day | ORAL | Status: DC
  Administered 2015-09-25: 40 meq via ORAL
  Filled 2015-09-25: qty 2

## 2015-09-25 MED ORDER — LYRICA 150 MG PO CAPS: 150.0000 mg | ORAL_CAPSULE | Freq: Two times a day (BID) | ORAL | Status: DC | PRN

## 2015-09-25 MED ORDER — POTASSIUM CHLORIDE CRYS ER 20 MEQ PO TBCR: 20.0000 meq | EXTENDED_RELEASE_TABLET | Freq: Every day | ORAL | Status: DC

## 2015-09-25 MED ORDER — POTASSIUM CHLORIDE CRYS ER 20 MEQ PO TBCR
20.0000 meq | EXTENDED_RELEASE_TABLET | Freq: Once | ORAL | Status: AC
  Administered 2015-09-25: 20 meq via ORAL
  Filled 2015-09-25: qty 1

## 2015-09-25 MED ORDER — AMLODIPINE BESYLATE 10 MG PO TABS: 10.0000 mg | ORAL_TABLET | Freq: Every day | ORAL | Status: AC

## 2015-09-25 MED ORDER — TAMSULOSIN HCL 0.4 MG PO CAPS: 0.8000 mg | ORAL_CAPSULE | Freq: Every day | ORAL | Status: AC

## 2015-09-25 MED ORDER — ENALAPRIL MALEATE 20 MG PO TABS: 20.0000 mg | ORAL_TABLET | Freq: Two times a day (BID) | ORAL | Status: AC

## 2015-09-25 NOTE — Discharge Instructions (Signed)
Heart healthy diet. Activity as tolerated. HHPT. Fall and aspiration precaution.

## 2015-09-25 NOTE — Progress Notes (Signed)
MEDICATION RELATED CONSULT NOTE -follow up  Pharmacy Consult for Electrolytes  Indication: Potassium replacement   No Known Allergies  Patient Measurements: Height: 5\' 11"  (180.3 cm) Weight: 226 lb 3.2 oz (102.604 kg) IBW/kg (Calculated) : 75.3 Adjusted Body Weight:   Vital Signs: Temp: 98.2 F (36.8 C) (05/22 2025) Temp Source: Oral (05/22 2025) BP: 146/90 mmHg (05/22 2025) Pulse Rate: 91 (05/22 2025) Intake/Output from previous day: 05/22 0701 - 05/23 0700 In: 563 [P.O.:560; I.V.:3] Out: 450 [Urine:450] Intake/Output from this shift:    Labs:  Recent Labs  09/22/15 1409 09/23/15 0607 09/24/15 0423 09/25/15 0456  WBC 13.8* 14.0* 17.4* 15.9*  HGB 15.7 15.7 17.0 16.8  HCT 46.5 47.4 51.3 49.7  PLT 331 295 311 286  CREATININE 1.05 0.71 0.93 1.12  MG  --  2.0  --   --   ALBUMIN 4.1  --   --   --   PROT 7.3  --   --   --   AST 44*  --   --   --   ALT 24  --   --   --   ALKPHOS 62  --   --   --   BILITOT 1.0  --   --   --    Lab Results  Component Value Date   K 3.2* 09/25/2015   Estimated Creatinine Clearance: 63.1 mL/min (by C-G formula based on Cr of 1.12).   Medical History: Past Medical History  Diagnosis Date  . Hard of hearing   . Hypertension   . Nerve pain   . CHF (congestive heart failure) (HCC)   . GERD (gastroesophageal reflux disease)   . HLD (hyperlipidemia)   . A-fib (HCC)     Medications:  Scheduled:  . amLODipine  10 mg Oral Daily  . aspirin EC  81 mg Oral Daily  . enalapril  20 mg Oral BID  . enoxaparin (LOVENOX) injection  40 mg Subcutaneous Q24H  . pantoprazole  40 mg Oral Daily  . potassium chloride  20 mEq Oral Once  . potassium chloride SA  40 mEq Oral Daily  . sodium chloride flush  3 mL Intravenous Q12H  . sotalol  80 mg Oral BID  . tamsulosin  0.8 mg Oral QPC breakfast  . torsemide  20 mg Oral Daily    Assessment: 82 yo M  Acute encephalopathy - unclear etiology at this time.  5/21 :  K = 2.7  Goal of Therapy:   K :  3.5 - 5.1   Plan:  KCl 20 mEq PO X 1 given on 5/20. KCl 40 mEq PO BID X 2 doses  PLUS 20 mEq PO daily on 5/21 to make total dose of 100 mEq on 5/21. KCl 20 mEq PO daily starting on 5/22.  Pharmacy to follow and manage thereafter.   5/22: K= 3.3. Will give KCL po 20meq x 1 dose and continue current orders for KCL po 20meq daily. BMP with AM labs.   5/23: K=3.2. Will give KCL po 20meq x 1 and increase current order for KCL to 40meq daily. (Patient on Torsemide 20mg  daily.)  Chanel Mcadams A 09/25/2015,7:20 AM

## 2015-09-25 NOTE — Care Management (Signed)
Patient for discharge to memory care unit at [facility].  Order for home health physical therapy and SN.  [facility] for the facility

## 2015-09-25 NOTE — Discharge Summary (Addendum)
[facility] - [place] at [facility]   PATIENT NAME: Daniel Barr    MR#:  [id number]  DATE OF BIRTH:  1931-08-14  DATE OF ADMISSION:  09/22/2015 ADMITTING PHYSICIAN: Oralia Manis, MD  DATE OF DISCHARGE: 09/25/2015 PRIMARY CARE PHYSICIAN: Derwood Kaplan, MD    ADMISSION DIAGNOSIS:  Hypokalemia [E87.6] Encephalopathy acute [G93.40] Altered mental status, unspecified altered mental status type [R41.82]   DISCHARGE DIAGNOSIS:  Acute encephalopathy, likely combination of dementia and medication dementia and very hard hearing. Hypokalemia. SECONDARY DIAGNOSIS:   Past Medical History  Diagnosis Date  . Hard of hearing   . Hypertension   . Nerve pain   . CHF (congestive heart failure) (HCC)   . GERD (gastroesophageal reflux disease)   . HLD (hyperlipidemia)   . A-fib [facility])     HOSPITAL COURSE:  Acute encephalopathy, likely combination of dementia and medication. Patient was on tramadol and Lyrica and Flexeril, which could be causing his confusion he took mistaken doses of these medicines. Per neurology consult, no further neuro imaging.  Dementia and very hard hearing. Follow and aspiration precaution. Physical therapy evaluation.  Hypokalemia. Improving with potassium supplement and follow-up level. Magnesium is normal.  Leukocytosis. Unclear ideology, no source of infection. Better, Follow-up CBC as outpatient.   HTN (hypertension). Continue home hypertension medication.   Chronic A-fib. Continue sotalol.    GERD (gastroesophageal reflux disease) - equivalent home dose PPI   DISCHARGE CONDITIONS:   Stable, discharge to ALF with HHPT.  CONSULTS OBTAINED:  Treatment Team:  Pauletta Browns, MD  DRUG ALLERGIES:  No Known Allergies  DISCHARGE MEDICATIONS:   Current Discharge Medication List    CONTINUE these medications which have CHANGED   Details  amLODipine (NORVASC) 10 MG tablet Take 1 tablet (10 mg total) by mouth daily. Qty:  30 tablet, Refills: 0    aspirin EC 81 MG tablet Take 1 tablet (81 mg total) by mouth daily. Qty: 30 tablet, Refills: 0    enalapril (VASOTEC) 20 MG tablet Take 1 tablet (20 mg total) by mouth 2 (two) times daily. Qty: 60 tablet, Refills: 0    LYRICA 150 MG capsule Take 1 capsule (150 mg total) by mouth 2 (two) times daily as needed. For pain. Qty: 60 capsule, Refills: 0    omeprazole (PRILOSEC) 20 MG capsule Take 1 capsule (20 mg total) by mouth 2 (two) times daily. Qty: 30 capsule, Refills: 0    potassium chloride SA (K-DUR,KLOR-CON) 20 MEQ tablet Take 1 tablet (20 mEq total) by mouth daily. Take with Torsemide. Qty: 30 tablet, Refills: 0    sotalol (BETAPACE) 80 MG tablet Take 1 tablet (80 mg total) by mouth 2 (two) times daily. Qty: 60 tablet, Refills: 0    tamsulosin (FLOMAX) 0.4 MG CAPS capsule Take 2 capsules (0.8 mg total) by mouth daily. Qty: 30 capsule, Refills: 0    torsemide (DEMADEX) 20 MG tablet Take 1 tablet (20 mg total) by mouth daily. Qty: 30 tablet, Refills: 0      STOP taking these medications     traMADol (ULTRAM) 50 MG tablet          DISCHARGE INSTRUCTIONS:    If you experience worsening of your admission symptoms, develop shortness of breath, life threatening emergency, suicidal or homicidal thoughts you must seek medical attention immediately by calling 911 or calling your MD immediately  if symptoms less severe.  You Must read complete instructions/literature along with all the possible adverse reactions/side effects for all  the Medicines you take and that have been prescribed to you. Take any new Medicines after you have completely understood and accept all the possible adverse reactions/side effects.   Please note  You were cared for by a hospitalist during your hospital stay. If you have any questions about your discharge medications or the care you received while you were in the hospital after you are discharged, you can call the unit and  asked to speak with the hospitalist on call if the hospitalist that took care of you is not available. Once you are discharged, your primary care physician will handle any further medical issues. Please note that NO REFILLS for any discharge medications will be authorized once you are discharged, as it is imperative that you return to your primary care physician (or establish a relationship with a primary care physician if you do not have one) for your aftercare needs so that they can reassess your need for medications and monitor your lab values.    Today   SUBJECTIVE    No complaint.  VITAL SIGNS:  Blood pressure 156/68, pulse 85, temperature 98.2 F (36.8 C), temperature source Oral, resp. rate 20, height 5\' 11"  (1.803 m), weight 102.604 kg (226 lb 3.2 oz), SpO2 90 %.  I/O:   Intake/Output Summary (Last 24 hours) at 09/25/15 1017 Last data filed at 09/25/15 0400  Gross per 24 hour  Intake    323 ml  Output    350 ml  Net    -27 ml    PHYSICAL EXAMINATION:  GENERAL:  82 y.o.-year-old patient lying in the bed with no acute distress.  EYES: Pupils equal, round, reactive to light and accommodation. No scleral icterus. Extraocular muscles intact.  HEENT: Head atraumatic, normocephalic. Oropharynx and nasopharynx clear.  NECK:  Supple, no jugular venous distention. No thyroid enlargement, no tenderness.  LUNGS: Normal breath sounds bilaterally, no wheezing, rales,rhonchi or crepitation. No use of accessory muscles of respiration.  CARDIOVASCULAR: S1, S2 normal. No murmurs, rubs, or gallops.  ABDOMEN: Soft, non-tender, non-distended. Bowel sounds present. No organomegaly or mass.  EXTREMITIES: No pedal edema, cyanosis, or clubbing.  NEUROLOGIC: Cranial nerves II through XII are intact. Muscle strength 4/5 in all extremities. Sensation intact. Gait not checked.  PSYCHIATRIC: The patient is Demented. SKIN: No obvious rash, lesion, or ulcer.   DATA REVIEW:   CBC  Recent Labs Lab  09/25/15 0456  WBC 15.9*  HGB 16.8  HCT 49.7  PLT 286    Chemistries   Recent Labs Lab 09/22/15 1409 09/23/15 0607  09/25/15 0456  NA 144 144  < > 147*  K 2.7* 2.7*  < > 3.2*  CL 109 113*  < > 114*  CO2 24 23  < > 24  GLUCOSE 116* 123*  < > 97  BUN 10 7  < > 19  CREATININE 1.05 0.71  < > 1.12  CALCIUM 9.0 8.7*  < > 9.0  MG  --  2.0  --   --   AST 44*  --   --   --   ALT 24  --   --   --   ALKPHOS 62  --   --   --   BILITOT 1.0  --   --   --   < > = values in this interval not displayed.  Cardiac Enzymes  Recent Labs Lab 09/22/15 1409  TROPONINI <0.03    Microbiology Results  Results for orders placed or performed during the  hospital encounter of 09/22/15  Blood culture (routine x 2)     Status: None (Preliminary result)   Collection Time: 09/22/15  1:25 PM  Result Value Ref Range Status   Specimen Description BLOOD RIGHT ASSIST CONTROL  Final   Special Requests   Final    BOTTLES DRAWN AEROBIC AND ANAEROBIC 6CC AERO 7CC ANA   Culture NO GROWTH 2 DAYS  Final   Report Status PENDING  Incomplete  Blood culture (routine x 2)     Status: None (Preliminary result)   Collection Time: 09/22/15  1:25 PM  Result Value Ref Range Status   Specimen Description BLOOD LEFT ASSIST CONTROL  Final   Special Requests   Final    BOTTLES DRAWN AEROBIC AND ANAEROBIC 8CC AERO 6CC ANA   Culture NO GROWTH 2 DAYS  Final   Report Status PENDING  Incomplete  Urine culture     Status: None   Collection Time: 09/22/15 10:45 PM  Result Value Ref Range Status   Specimen Description URINE, RANDOM  Final   Special Requests NONE  Final   Culture   Final    INSIGNIFICANT GROWTH Performed at [facility]    Report Status 09/24/2015 FINAL  Final    RADIOLOGY:  Dg Chest 2 View  09/24/2015  ADDENDUM REPORT: 09/24/2015 14:51 ADDENDUM: Additional history provided:  Clinical concern for TB. No suspicious features for TB identified. Electronically Signed   By: Ulyses Southward M.D.   On:  09/24/2015 14:51  09/24/2015  CLINICAL DATA:  Confusion, weakness, dementia, hypertension, CHF, atrial fibrillation, former smoker EXAM: CHEST  2 VIEW COMPARISON:  09/22/2015 radiographic and CT exams FINDINGS: Upper normal heart size. Atherosclerotic calcification aorta. Mediastinal contours and pulmonary vascularity normal. Accentuation of interstitial markings similar to previous exam. 5 mm diameter RIGHT mid lung nodule as noted on prior CT. Minimal atelectasis at LEFT base. No acute infiltrate, pleural effusion or pneumothorax. Diffuse osseous demineralization. IMPRESSION: Chronic accentuation of interstitial markings with subsegmental atelectasis at LEFT base. 5 mm RIGHT mid lung nodule as noted on prior CT. Electronically Signed: By: Ulyses Southward M.D. On: 09/24/2015 13:11        Management plans discussed with the patient, is son and they are in agreement.  CODE STATUS:     Code Status Orders        Start     Ordered   09/22/15 2223  Do not attempt resuscitation (DNR)   Continuous    Question Answer Comment  In the event of cardiac or respiratory ARREST Do not call a "code blue"   In the event of cardiac or respiratory ARREST Do not perform Intubation, CPR, defibrillation or ACLS   In the event of cardiac or respiratory ARREST Use medication by any route, position, wound care, and other measures to relive pain and suffering. May use oxygen, suction and manual treatment of airway obstruction as needed for comfort.      09/22/15 2222    Code Status History    Date Active Date Inactive Code Status Order ID Comments User Context   This patient has a current code status but no historical code status.      TOTAL TIME TAKING CARE OF THIS PATIENT: 38 minutes.    Shaune Pollack M.D on 09/25/2015 at 10:17 AM  Between 7am to 6pm - Pager - [phone]  After 6pm go to [email or web address] - password [email or web address] [facility]  [facility]  Office  [phone]  CC: Primary care physician;  Derwood Kaplan, MD

## 2015-09-25 NOTE — Care Management Important Message (Signed)
Important Message  Patient Details  Name: Daniel Barr MRN: [id number] Date of Birth: 11/08/1931   Medicare Important Message Given:  Yes    Olegario MessierKathy A Jabir Dahlem 09/25/2015, 11:28 AM

## 2015-09-25 NOTE — Care Management Important Message (Signed)
Important Message  Patient Details  Name: Daniel Barr MRN: [id number] Date of Birth: 09/30/1931   Medicare Important Message Given:  Yes    Olegario MessierKathy A Kalai Baca 09/25/2015, 11:08 AM

## 2015-09-25 NOTE — NC FL2 (Addendum)
[place] MEDICAID FL2 LEVEL OF CARE SCREENING TOOL     IDENTIFICATION  Patient Name: Daniel Barr Birthdate: [birth year] Sex: male Admission Date (Current Location): 09/22/2015  [place] and [place] Number:  Chiropodist and Address:  [facility], [address], [place], [place] [address]      Provider Number: [id number]  Attending Physician Name and Address:  Shaune Pollack, MD  Relative Name and Phone Number:       Current Level of Care: Hospital Recommended Level of Care: Assisted Living Facility, Memory Care (secure unit) Prior Approval Number:    Date Approved/Denied:   PASRR Number: [id number]  Discharge Plan: Memory Care at ALF    Current Diagnoses: Patient Active Problem List   Diagnosis Date Noted  . Acute encephalopathy 09/22/2015  . HTN (hypertension) 09/22/2015  . A-fib (HCC) 09/22/2015  . GERD (gastroesophageal reflux disease) 09/22/2015        Dementia  Orientation RESPIRATION BLADDER Height & Weight     Self, Time, Situation, Place  Normal Continent Weight: 226 lb 3.2 oz (102.604 kg) Height:   (180.3 cm)  BEHAVIORAL SYMPTOMS/MOOD NEUROLOGICAL BOWEL NUTRITION STATUS   (Tends to try and get up out of bed and is very unsteady)  (none) Continent Diet (heart healthy)  AMBULATORY STATUS COMMUNICATION OF NEEDS Skin   Supervision Verbally Normal                       Personal Care Assistance Level of Assistance  Bathing, Feeding, Dressing Bathing Assistance: Limited assistance Feeding assistance: Limited assistance Dressing Assistance: Limited assistance     Functional Limitations Info  Hearing (has hearing aides)   Hearing Info: Impaired      SPECIAL CARE FACTORS FREQUENCY  PT (By licensed PT) (will have home health ordered at discharge from hospital)                    Contractures Contractures Info: Not present    Additional Factors Info  Code Status Code Status Info: DNR              Current Discharge Medication List    CONTINUE these medications which have CHANGED   Details  amLODipine (NORVASC) 10 MG tablet Take 1 tablet (10 mg total) by mouth daily. Qty: 30 tablet, Refills: 0    aspirin EC 81 MG tablet Take 1 tablet (81 mg total) by mouth daily. Qty: 30 tablet, Refills: 0    enalapril (VASOTEC) 20 MG tablet Take 1 tablet (20 mg total) by mouth 2 (two) times daily. Qty: 60 tablet, Refills: 0    LYRICA 150 MG capsule Take 1 capsule (150 mg total) by mouth 2 (two) times daily as needed. For pain. Qty: 60 capsule, Refills: 0    omeprazole (PRILOSEC) 20 MG capsule Take 1 capsule (20 mg total) by mouth 2 (two) times daily. Qty: 30 capsule, Refills: 0    potassium chloride SA (K-DUR,KLOR-CON) 20 MEQ tablet Take 1 tablet (20 mEq total) by mouth daily. Take with Torsemide. Qty: 30 tablet, Refills: 0    sotalol (BETAPACE) 80 MG tablet Take 1 tablet (80 mg total) by mouth 2 (two) times daily. Qty: 60 tablet, Refills: 0    tamsulosin (FLOMAX) 0.4 MG CAPS capsule Take 2 capsules (0.8 mg total) by mouth daily. Qty: 30 capsule, Refills: 0    torsemide (DEMADEX) 20 MG tablet Take 1 tablet (20 mg total) by mouth daily. Qty: 30 tablet, Refills:  0      STOP taking these medications     traMADol (ULTRAM) 50 MG tablet           Additional Information SS: [id number]  York SpanielMonica Khamiya Varin, LCSW

## 2015-09-25 NOTE — Progress Notes (Signed)
Report given to nurse who will be receiving pt at [facility]. [facility]. IV d/c'd. Skin intact except as charted in most recent assessments. Vitals are stable. Pt was wheeled out by NT to be transported to facility by son.  Daniel Barr

## 2015-09-25 NOTE — Clinical Social Work Note (Signed)
Daniel Barr at [facility] informed CSW this morning that they can take patient today. Dr. Imogene Burnhen was able to resolve the issue with the CXR from yesterday. CSW awaiting discharge information and scripts for all discharge medications as patient is going to a new ALF. Will update FL2 and send for signature once this has been completed. York SpanielMonica Maliea Grandmaison MSW,LCSW

## 2015-09-27 LAB — CULTURE, BLOOD (ROUTINE X 2)
CULTURE: NO GROWTH
Culture: NO GROWTH

## 2015-11-21 ENCOUNTER — Emergency Department: Payer: Medicare Other

## 2015-11-21 ENCOUNTER — Observation Stay
Admission: EM | Admit: 2015-11-21 | Discharge: 2015-11-22 | Disposition: A | Discharge status: Home / Self Care | Payer: Medicare Other | Attending: Internal Medicine | Admitting: Internal Medicine

## 2015-11-21 ENCOUNTER — Encounter: Payer: Self-pay | Admitting: Emergency Medicine

## 2015-11-21 DIAGNOSIS — R0902 Hypoxemia: Secondary | ICD-10-CM | POA: Insufficient documentation

## 2015-11-21 DIAGNOSIS — F0390 Unspecified dementia without behavioral disturbance: Secondary | ICD-10-CM | POA: Insufficient documentation

## 2015-11-21 DIAGNOSIS — R55 Syncope and collapse: Secondary | ICD-10-CM | POA: Insufficient documentation

## 2015-11-21 DIAGNOSIS — Z8673 Personal history of transient ischemic attack (TIA), and cerebral infarction without residual deficits: Secondary | ICD-10-CM | POA: Diagnosis not present

## 2015-11-21 DIAGNOSIS — I110 Hypertensive heart disease with heart failure: Secondary | ICD-10-CM | POA: Insufficient documentation

## 2015-11-21 DIAGNOSIS — I700 Atherosclerosis of aorta: Secondary | ICD-10-CM | POA: Diagnosis not present

## 2015-11-21 DIAGNOSIS — E785 Hyperlipidemia, unspecified: Secondary | ICD-10-CM | POA: Insufficient documentation

## 2015-11-21 DIAGNOSIS — I509 Heart failure, unspecified: Secondary | ICD-10-CM | POA: Insufficient documentation

## 2015-11-21 DIAGNOSIS — J323 Chronic sphenoidal sinusitis: Secondary | ICD-10-CM | POA: Insufficient documentation

## 2015-11-21 DIAGNOSIS — Z809 Family history of malignant neoplasm, unspecified: Secondary | ICD-10-CM | POA: Insufficient documentation

## 2015-11-21 DIAGNOSIS — G9340 Encephalopathy, unspecified: Secondary | ICD-10-CM | POA: Insufficient documentation

## 2015-11-21 DIAGNOSIS — Z7982 Long term (current) use of aspirin: Secondary | ICD-10-CM | POA: Insufficient documentation

## 2015-11-21 DIAGNOSIS — E049 Nontoxic goiter, unspecified: Secondary | ICD-10-CM | POA: Diagnosis not present

## 2015-11-21 DIAGNOSIS — Z79899 Other long term (current) drug therapy: Secondary | ICD-10-CM | POA: Insufficient documentation

## 2015-11-21 DIAGNOSIS — I4891 Unspecified atrial fibrillation: Secondary | ICD-10-CM | POA: Insufficient documentation

## 2015-11-21 DIAGNOSIS — Z9842 Cataract extraction status, left eye: Secondary | ICD-10-CM | POA: Insufficient documentation

## 2015-11-21 DIAGNOSIS — H9190 Unspecified hearing loss, unspecified ear: Secondary | ICD-10-CM | POA: Diagnosis not present

## 2015-11-21 DIAGNOSIS — Z9660 Presence of unspecified orthopedic joint implant: Secondary | ICD-10-CM | POA: Insufficient documentation

## 2015-11-21 DIAGNOSIS — Z8249 Family history of ischemic heart disease and other diseases of the circulatory system: Secondary | ICD-10-CM | POA: Insufficient documentation

## 2015-11-21 DIAGNOSIS — Z9841 Cataract extraction status, right eye: Secondary | ICD-10-CM | POA: Insufficient documentation

## 2015-11-21 DIAGNOSIS — E876 Hypokalemia: Secondary | ICD-10-CM | POA: Insufficient documentation

## 2015-11-21 DIAGNOSIS — J449 Chronic obstructive pulmonary disease, unspecified: Secondary | ICD-10-CM | POA: Insufficient documentation

## 2015-11-21 DIAGNOSIS — N390 Urinary tract infection, site not specified: Principal | ICD-10-CM | POA: Diagnosis present

## 2015-11-21 DIAGNOSIS — Z87891 Personal history of nicotine dependence: Secondary | ICD-10-CM | POA: Diagnosis not present

## 2015-11-21 DIAGNOSIS — Z823 Family history of stroke: Secondary | ICD-10-CM | POA: Insufficient documentation

## 2015-11-21 DIAGNOSIS — K219 Gastro-esophageal reflux disease without esophagitis: Secondary | ICD-10-CM | POA: Insufficient documentation

## 2015-11-21 DIAGNOSIS — Z961 Presence of intraocular lens: Secondary | ICD-10-CM | POA: Insufficient documentation

## 2015-11-21 LAB — CBC
HCT: 45.3 % (ref 40.0–52.0)
Hemoglobin: 15.7 g/dL (ref 13.0–18.0)
MCH: 30.7 pg (ref 26.0–34.0)
MCHC: 34.7 g/dL (ref 32.0–36.0)
MCV: 88.5 fL (ref 80.0–100.0)
PLATELETS: 299 10*3/uL (ref 150–440)
RBC: 5.12 MIL/uL (ref 4.40–5.90)
RDW: 14.7 % — AB (ref 11.5–14.5)
WBC: 11.5 10*3/uL — ABNORMAL HIGH (ref 3.8–10.6)

## 2015-11-21 LAB — BASIC METABOLIC PANEL
Anion gap: 9 (ref 5–15)
BUN: 14 mg/dL (ref 6–20)
CHLORIDE: 105 mmol/L (ref 101–111)
CO2: 26 mmol/L (ref 22–32)
CREATININE: 1.22 mg/dL (ref 0.61–1.24)
Calcium: 8.7 mg/dL — ABNORMAL LOW (ref 8.9–10.3)
GFR calc Af Amer: >60 mL/min (ref >60)
GFR calc non Af Amer: 54 mL/min — ABNORMAL LOW (ref >60)
GLUCOSE: 98 mg/dL (ref 65–99)
Potassium: 2.8 mmol/L — ABNORMAL LOW (ref 3.5–5.1)
Sodium: 140 mmol/L (ref 135–145)

## 2015-11-21 LAB — TROPONIN I: Troponin I: <0.03 ng/mL (ref <0.03)

## 2015-11-21 MED ORDER — IOPAMIDOL (ISOVUE-370) INJECTION 76%
100.0000 mL | Freq: Once | INTRAVENOUS | Status: AC | PRN
  Administered 2015-11-21: 100 mL via INTRAVENOUS

## 2015-11-21 NOTE — ED Notes (Signed)
In/out cath, assisted by Myra NT.

## 2015-11-21 NOTE — ED Provider Notes (Signed)
[facility] Emergency Department Provider Note  ____________________________________________  Time seen: 11:00PM  I have reviewed the triage vital signs and the nursing notes.   HISTORY  Chief Complaint Near Syncope    HPI Daniel Barr is a 82 y.o. male presents after near syncopal episode at [facility]. Per patient's family at bedside they were notified by the nursing staff that the patient while in route walking to the nursing station started to "go down but was caught therefore no fall. Patient's son states that he's had increasing levels of confusion over the past 2 weeks.     Past Medical History  Diagnosis Date  . Hard of hearing   . Hypertension   . Nerve pain   . CHF (congestive heart failure) (HCC)   . GERD (gastroesophageal reflux disease)   . HLD (hyperlipidemia)   . A-fib [facility](HCC)     Patient Active Problem List   Diagnosis Date Noted  . Acute encephalopathy 09/22/2015  . HTN (hypertension) 09/22/2015  . A-fib (HCC) 09/22/2015  . GERD (gastroesophageal reflux disease) 09/22/2015    Past Surgical History  Procedure Laterality Date  . Colonoscopy    . Joint replacement      Current Outpatient Rx  Name  Route  Sig  Dispense  Refill  . acetaminophen (TYLENOL) 650 MG CR tablet   Oral   Take 650 mg by mouth every 8 (eight) hours as needed for pain.         Marland Kitchen. amLODipine (NORVASC) 10 MG tablet   Oral   Take 1 tablet (10 mg total) by mouth daily.   30 tablet   0   . aspirin EC 81 MG tablet   Oral   Take 1 tablet (81 mg total) by mouth daily.   30 tablet   0   . enalapril (VASOTEC) 20 MG tablet   Oral   Take 1 tablet (20 mg total) by mouth 2 (two) times daily.   60 tablet   0   . erythromycin ophthalmic ointment   Both Eyes   Place 1 application into both eyes 2 (two) times daily.         . hydrocortisone cream 1 %   Topical   Apply 1 application topically daily. Apply to both legs for stasis dermatitis          . loperamide (IMODIUM) 2 MG capsule   Oral   Take 2 mg by mouth as needed for diarrhea or loose stools.         Marland Kitchen. omeprazole (PRILOSEC) 20 MG capsule   Oral   Take 1 capsule (20 mg total) by mouth 2 (two) times daily.   30 capsule   0   . potassium chloride SA (K-DUR,KLOR-CON) 20 MEQ tablet   Oral   Take 1 tablet (20 mEq total) by mouth daily. Take with Torsemide.   30 tablet   0   . sotalol (BETAPACE) 80 MG tablet   Oral   Take 1 tablet (80 mg total) by mouth 2 (two) times daily.   60 tablet   0   . tamsulosin (FLOMAX) 0.4 MG CAPS capsule   Oral   Take 2 capsules (0.8 mg total) by mouth daily.   30 capsule   0   . torsemide (DEMADEX) 20 MG tablet   Oral   Take 1 tablet (20 mg total) by mouth daily.   30 tablet   0   . LYRICA 150 MG capsule  Oral   Take 1 capsule (150 mg total) by mouth 2 (two) times daily as needed. For pain. Patient not taking: Reported on 11/21/2015   60 capsule   0     Dispense as written.     Allergies No known drug allergies  Family History  Problem Relation Age of Onset  . Hypertension    . Stroke    . Cancer      Social History Social History  Substance Use Topics  . Smoking status: Former Games developer  . Smokeless tobacco: None  . Alcohol Use: Yes    Review of Systems  Constitutional: Negative for fever. Eyes: Negative for visual changes. ENT: Negative for sore throat. Cardiovascular: Negative for chest pain. Respiratory: Negative for shortness of breath. Gastrointestinal: Negative for abdominal pain, vomiting and diarrhea. Genitourinary: Negative for dysuria. Musculoskeletal: Negative for back pain. Skin: Negative for rash. Neurological: Positive for altered mental status/syncope  10-point ROS otherwise negative.  ____________________________________________   PHYSICAL EXAM:  VITAL SIGNS: ED Triage Vitals  Enc Vitals Group     BP 11/21/15 2218 166/70 mmHg     Pulse Rate 11/21/15 2218 64     Resp  11/21/15 2218 19     Temp 11/21/15 2218 98.4 F (36.9 C)     Temp Source 11/21/15 2218 Oral     SpO2 11/21/15 2218 92 %     Weight 11/21/15 2218 226 lb (102.513 kg)     Height --      Head Cir --      Peak Flow --      Pain Score 11/21/15 2219 0     Pain Loc --      Pain Edu? --      Excl. in GC? --      Constitutional: Alert and Pleasantly confused Eyes: Conjunctivae are normal. PERRL. Normal extraocular movements. ENT   Head: Normocephalic and atraumatic.   Nose: No congestion/rhinnorhea.   Mouth/Throat: Mucous membranes are moist.   Neck: No stridor. Hematological/Lymphatic/Immunilogical: No cervical lymphadenopathy. Cardiovascular: Normal rate, regular rhythm. Normal and symmetric distal pulses are present in all extremities. No murmurs, rubs, or gallops. Respiratory: Normal respiratory effort without tachypnea nor retractions. Breath sounds are clear and equal bilaterally. No wheezes/rales/rhonchi. Gastrointestinal: Soft and nontender. No distention. There is no CVA tenderness. Genitourinary: deferred Musculoskeletal: Nontender with normal range of motion in all extremities. No joint effusions.  No lower extremity tenderness nor edema. Neurologic:  Normal speech and language. No gross focal neurologic deficits are appreciated. Speech is normal.  Skin:  Skin is warm, dry and intact. No rash noted. Psychiatric: Mood and affect are normal. Speech and behavior are normal. Patient exhibits appropriate insight and judgment.  ____________________________________________    LABS (pertinent positives/negatives)  Labs Reviewed  BASIC METABOLIC PANEL - Abnormal; Notable for the following:    Potassium 2.8 (*)    Calcium 8.7 (*)    GFR calc non Af Amer 54 (*)    All other components within normal limits  CBC - Abnormal; Notable for the following:    WBC 11.5 (*)    RDW 14.7 (*)    All other components within normal limits  URINALYSIS COMPLETEWITH MICROSCOPIC  ([facility] ONLY) - Abnormal; Notable for the following:    Color, Urine YELLOW (*)    APPearance CLEAR (*)    Bacteria, UA FEW (*)    Squamous Epithelial / LPF 0-5 (*)    All other components within normal limits  CBC - Abnormal; Notable  for the following:    RDW 14.9 (*)    All other components within normal limits  MRSA PCR SCREENING  URINE CULTURE  URINE CULTURE  TROPONIN I  COMPREHENSIVE METABOLIC PANEL     ____________________________________________   EKG  ED ECG REPORT I, Acworth N Reeve Mallo, the attending physician, personally viewed and interpreted this ECG.   Date: 11/22/2015  EKG Time: 10:12 PM  Rate: 68  Rhythm: Normal sinus rhythm  Axis: Normal  Intervals: Normal  ST&T Change: None   ____________________________________________    RADIOLOGY  CT Head Wo Contrast (Final result) Result time: 11/22/15 00:31:16   Final result by Rad Results In Interface (11/22/15 00:31:16)   Narrative:   CLINICAL DATA: Hypoxia, near syncope. Altered mental status. History of hypertension atrial fibrillation.  EXAM: CT HEAD WITHOUT CONTRAST  TECHNIQUE: Contiguous axial images were obtained from the base of the skull through the vertex without intravenous contrast.  COMPARISON: CT HEAD Sep 22, 2015  FINDINGS: INTRACRANIAL CONTENTS: The ventricles and sulci are normal for age. No intraparenchymal hemorrhage, mass effect nor midline shift. Patchy sick supratentorial white matter hypodensities. Old RIGHT basal ganglia lacunar infarct. Old RIGHT pontine lacunar infarct. No acute large vascular territory infarcts. No abnormal extra-axial fluid collections. Basal cisterns are patent. Mild calcific atherosclerosis of the carotid siphons.  ORBITS: The included ocular globes and orbital contents are non-suspicious. Status post bilateral ocular lens implants.  SINUSES: Similar severe RIGHT sphenoid sinusitis. Mastoid air cells are well aerated.  SKULL/SOFT TISSUES: No  skull fracture. No significant soft tissue swelling.  IMPRESSION: No acute intracranial process.  Stable involutional changes, moderate to severe chronic small vessel ischemic disease and old RIGHT basal ganglia lacunar infarct.   Electronically Signed By: Awilda Metro M.D. On: 11/22/2015 00:31          CT Angio Chest PE W/Cm &/Or Wo Cm (Final result) Result time: 11/22/15 00:38:51   Final result by Rad Results In Interface (11/22/15 00:38:51)   Narrative:   CLINICAL DATA: Hypoxia near syncope  EXAM: CT ANGIOGRAPHY CHEST WITH CONTRAST  TECHNIQUE: Multidetector CT imaging of the chest was performed using the standard protocol during bolus administration of intravenous contrast. Multiplanar CT image reconstructions and MIPs were obtained to evaluate the vascular anatomy.  CONTRAST: 100 mL of Isovue 370  COMPARISON: CT of the chest Sep 04, 2015  FINDINGS: Central airways are normal. Nodules on the right lung on series 6, images 62 and 63 ye are ar stable measuring 6 and 5 mm. A 12 mm nodule anteriorly in the right lung on series 6, image 82 is stable as well. No other suspicious nodules. No masses or focal infiltrates. Mild dependent atelectasis. Stable thyroid goiter. No effusions. The heart size is unchanged. There are coronary artery calcifications identified. Atherosclerosis is seen in the non aneurysmal thoracic aorta. No adenopathy in the chest. No pulmonary emboli.  Stable adrenal nodularity, likely adenomas. No other changes in the upper abdomen. No acute bony abnormalities.  Review of the MIP images confirms the above findings.  IMPRESSION: 1. No pulmonary emboli. 2. Pulmonary nodularity, unchanged. See below for follow-up recommendations.   Electronically Signed By: Gerome Sam III M.D On: 11/22/2015 00:38          DG Chest 2 View (Final result) Result time: 11/21/15 23:13:31   Final result by Rad Results In Interface  (11/21/15 23:13:31)   Narrative:   CLINICAL DATA: Near syncopal episode at nursing home.  EXAM: CHEST 2 VIEW  COMPARISON: Radiographs and  CT 09/22/2015  FINDINGS: Lung volumes are low. Unchanged mediastinal contours with aortic tortuosity and atherosclerosis. Chronic interstitial prominence without progression. Subsegmental atelectasis at the left lung base. Small pulmonary nodules on prior radiograph and CT are not as well visualized currently. No confluent airspace disease. No pleural effusion or pneumothorax.  IMPRESSION: Chronic interstitial prominence. No acute abnormality.  Atherosclerosis of thoracic aorta.   Electronically Signed By: Rubye Oaks M.D. On: 11/21/2015 23:13     Procedures     INITIAL IMPRESSION / ASSESSMENT AND PLAN / ED COURSE  Pertinent labs & imaging results that were available during my care of the patient were reviewed by me and considered in my medical decision making (see chart for details).  Patient given ceftriaxone 1 g urine culture pending. Patient discussed with hospitalist on call for hospital admission for further evaluation and management   ____________________________________________   FINAL CLINICAL IMPRESSION(S) / ED DIAGNOSES  Final diagnoses:  None  Urinary tract infection    Darci Current, MD 11/22/15 2247

## 2015-11-21 NOTE — ED Notes (Addendum)
Pt presents to ED via [facility] with for near syncope. Pt nearly fell per [facility]. Pt negative for stroke symptoms per EMS. Pt alerts and oriented x4 at this time, airway intact. DNR form arrived with pt. Per EMS CBG-95, HR-61, O2-95% on room air.

## 2015-11-21 NOTE — ED Notes (Signed)
Patient transported to CT 

## 2015-11-22 ENCOUNTER — Encounter: Payer: Self-pay | Admitting: Family Medicine

## 2015-11-22 DIAGNOSIS — N390 Urinary tract infection, site not specified: Secondary | ICD-10-CM | POA: Diagnosis present

## 2015-11-22 LAB — COMPREHENSIVE METABOLIC PANEL
ALBUMIN: 3.7 g/dL (ref 3.5–5.0)
ALK PHOS: 52 U/L (ref 38–126)
ALT: 13 U/L — ABNORMAL LOW (ref 17–63)
AST: 19 U/L (ref 15–41)
Anion gap: 7 (ref 5–15)
BILIRUBIN TOTAL: 0.8 mg/dL (ref 0.3–1.2)
BUN: 10 mg/dL (ref 6–20)
CO2: 29 mmol/L (ref 22–32)
Calcium: 8.7 mg/dL — ABNORMAL LOW (ref 8.9–10.3)
Chloride: 107 mmol/L (ref 101–111)
Creatinine, Ser: 0.87 mg/dL (ref 0.61–1.24)
GFR calc Af Amer: >60 mL/min (ref >60)
GFR calc non Af Amer: >60 mL/min (ref >60)
GLUCOSE: 99 mg/dL (ref 65–99)
POTASSIUM: 3 mmol/L — AB (ref 3.5–5.1)
SODIUM: 143 mmol/L (ref 135–145)
TOTAL PROTEIN: 6.7 g/dL (ref 6.5–8.1)

## 2015-11-22 LAB — URINALYSIS COMPLETE WITH MICROSCOPIC (ARMC ONLY)
BILIRUBIN URINE: NEGATIVE
GLUCOSE, UA: NEGATIVE mg/dL
Hgb urine dipstick: NEGATIVE
Ketones, ur: NEGATIVE mg/dL
Leukocytes, UA: NEGATIVE
Nitrite: NEGATIVE
PH: 5 (ref 5.0–8.0)
Protein, ur: NEGATIVE mg/dL
Specific Gravity, Urine: 1.021 (ref 1.005–1.030)

## 2015-11-22 LAB — MRSA PCR SCREENING: MRSA by PCR: NEGATIVE

## 2015-11-22 LAB — CBC
HEMATOCRIT: 45.1 % (ref 40.0–52.0)
HEMOGLOBIN: 15.6 g/dL (ref 13.0–18.0)
MCH: 30.9 pg (ref 26.0–34.0)
MCHC: 34.5 g/dL (ref 32.0–36.0)
MCV: 89.3 fL (ref 80.0–100.0)
Platelets: 287 10*3/uL (ref 150–440)
RBC: 5.05 MIL/uL (ref 4.40–5.90)
RDW: 14.9 % — AB (ref 11.5–14.5)
WBC: 9.3 10*3/uL (ref 3.8–10.6)

## 2015-11-22 MED ORDER — TORSEMIDE 20 MG PO TABS
20.0000 mg | ORAL_TABLET | Freq: Every day | ORAL | Status: DC
  Administered 2015-11-22: 20 mg via ORAL
  Filled 2015-11-22: qty 1

## 2015-11-22 MED ORDER — ERYTHROMYCIN 5 MG/GM OP OINT
1.0000 "application " | TOPICAL_OINTMENT | Freq: Two times a day (BID) | OPHTHALMIC | Status: DC
  Administered 2015-11-22 (×2): 1 via OPHTHALMIC
  Filled 2015-11-22: qty 3.5

## 2015-11-22 MED ORDER — POTASSIUM CHLORIDE 20 MEQ PO PACK
40.0000 meq | PACK | Freq: Once | ORAL | Status: AC
  Administered 2015-11-22: 40 meq via ORAL
  Filled 2015-11-22: qty 2

## 2015-11-22 MED ORDER — ENALAPRIL MALEATE 10 MG PO TABS
20.0000 mg | ORAL_TABLET | Freq: Two times a day (BID) | ORAL | Status: DC
  Administered 2015-11-22 (×2): 20 mg via ORAL
  Filled 2015-11-22 (×2): qty 2

## 2015-11-22 MED ORDER — POTASSIUM CHLORIDE IN NACL 40-0.9 MEQ/L-% IV SOLN
INTRAVENOUS | Status: DC
  Administered 2015-11-22: 75 mL/h via INTRAVENOUS
  Filled 2015-11-22: qty 1000

## 2015-11-22 MED ORDER — POTASSIUM CHLORIDE 20 MEQ PO PACK
40.0000 meq | PACK | ORAL | Status: AC
  Administered 2015-11-22 (×2): 40 meq via ORAL
  Filled 2015-11-22 (×2): qty 2

## 2015-11-22 MED ORDER — DEXTROSE 5 % IV SOLN
1.0000 g | Freq: Once | INTRAVENOUS | Status: AC
  Administered 2015-11-22: 1 g via INTRAVENOUS
  Filled 2015-11-22: qty 10

## 2015-11-22 MED ORDER — ACETAMINOPHEN 325 MG PO TABS: 650.0000 mg | ORAL_TABLET | Freq: Three times a day (TID) | ORAL | Status: DC | PRN

## 2015-11-22 MED ORDER — HYDROCODONE-ACETAMINOPHEN 5-325 MG PO TABS: 1.0000 | ORAL_TABLET | ORAL | Status: DC | PRN

## 2015-11-22 MED ORDER — SOTALOL HCL 80 MG PO TABS
80.0000 mg | ORAL_TABLET | Freq: Two times a day (BID) | ORAL | Status: DC
  Filled 2015-11-22 (×2): qty 1

## 2015-11-22 MED ORDER — SODIUM CHLORIDE 0.9% FLUSH: 3.0000 mL | Freq: Two times a day (BID) | INTRAVENOUS | Status: DC

## 2015-11-22 MED ORDER — HYDROCORTISONE 1 % EX CREA
1.0000 "application " | TOPICAL_CREAM | Freq: Every day | CUTANEOUS | Status: DC
  Administered 2015-11-22: 1 via TOPICAL
  Filled 2015-11-22: qty 28

## 2015-11-22 MED ORDER — ACETAMINOPHEN 650 MG RE SUPP: 650.0000 mg | Freq: Four times a day (QID) | RECTAL | Status: DC | PRN

## 2015-11-22 MED ORDER — POTASSIUM CHLORIDE CRYS ER 20 MEQ PO TBCR: 20.0000 meq | EXTENDED_RELEASE_TABLET | Freq: Every day | ORAL | Status: DC

## 2015-11-22 MED ORDER — LOPERAMIDE HCL 2 MG PO CAPS: 2.0000 mg | ORAL_CAPSULE | ORAL | Status: DC | PRN

## 2015-11-22 MED ORDER — AMLODIPINE BESYLATE 10 MG PO TABS
10.0000 mg | ORAL_TABLET | Freq: Every day | ORAL | Status: DC
  Administered 2015-11-22: 10 mg via ORAL
  Filled 2015-11-22: qty 1

## 2015-11-22 MED ORDER — TAMSULOSIN HCL 0.4 MG PO CAPS
0.8000 mg | ORAL_CAPSULE | Freq: Every day | ORAL | Status: DC
  Administered 2015-11-22: 0.8 mg via ORAL
  Filled 2015-11-22: qty 2

## 2015-11-22 MED ORDER — POTASSIUM CHLORIDE IN NACL 40-0.9 MEQ/L-% IV SOLN
INTRAVENOUS | Status: DC
  Administered 2015-11-22: 75 mL/h via INTRAVENOUS
  Filled 2015-11-22 (×2): qty 1000

## 2015-11-22 MED ORDER — POTASSIUM CHLORIDE CRYS ER 20 MEQ PO TBCR: 40.0000 meq | EXTENDED_RELEASE_TABLET | Freq: Every day | ORAL | Status: AC

## 2015-11-22 MED ORDER — PANTOPRAZOLE SODIUM 40 MG PO TBEC
40.0000 mg | DELAYED_RELEASE_TABLET | Freq: Every day | ORAL | Status: DC
  Administered 2015-11-22: 40 mg via ORAL
  Filled 2015-11-22: qty 1

## 2015-11-22 MED ORDER — ENOXAPARIN SODIUM 40 MG/0.4ML ~~LOC~~ SOLN
40.0000 mg | Freq: Every day | SUBCUTANEOUS | Status: DC
  Administered 2015-11-22: 40 mg via SUBCUTANEOUS
  Filled 2015-11-22 (×3): qty 0.4

## 2015-11-22 MED ORDER — CEPHALEXIN 500 MG PO CAPS: 500.0000 mg | ORAL_CAPSULE | Freq: Three times a day (TID) | ORAL | Status: DC

## 2015-11-22 MED ORDER — DEXTROSE 5 % IV SOLN
1.0000 g | INTRAVENOUS | Status: DC
  Filled 2015-11-22: qty 10

## 2015-11-22 MED ORDER — DEXTROSE 5 % IV SOLN: 1.0000 g | INTRAVENOUS | Status: DC

## 2015-11-22 MED ORDER — ASPIRIN EC 81 MG PO TBEC
81.0000 mg | DELAYED_RELEASE_TABLET | Freq: Every day | ORAL | Status: DC
  Administered 2015-11-22: 81 mg via ORAL
  Filled 2015-11-22: qty 1

## 2015-11-22 MED ORDER — SENNOSIDES-DOCUSATE SODIUM 8.6-50 MG PO TABS: 1.0000 | ORAL_TABLET | Freq: Every evening | ORAL | Status: DC | PRN

## 2015-11-22 MED ORDER — CEFTRIAXONE SODIUM 1 G IJ SOLR
1.0000 g | INTRAMUSCULAR | Status: DC
  Filled 2015-11-22: qty 10

## 2015-11-22 NOTE — Progress Notes (Signed)
EMS arrived to transport at this time

## 2015-11-22 NOTE — Progress Notes (Signed)
Place pt on contact isolation for conjuntivitis left eye infection per Dr Tobi BastosPyreddy

## 2015-11-22 NOTE — Care Management Obs Status (Signed)
MEDICARE OBSERVATION STATUS NOTIFICATION   Patient Details  Name: Daniel CureBilly R Chesterfield MRN: [id number] Date of Birth: 03/16/1932   Medicare Observation Status Notification Given:  Yes  Message left for son Tiney RougeDavid Koffler [phone] explaining form and that copy would be left at patient bedside for son's record. RNCM contact number given [phone] for any questions.   Collie SiadAngela Tammie Yanda, RN 11/22/2015, 8:21 AM

## 2015-11-22 NOTE — H&P (Signed)
Daniel Barr is an 82 y.o. male.   Chief Complaint: Weakness  HPI: Mr. Daniel Barr is an 82 year old white male with PMH of HTN, CHF, GERD, HLD, afib who presented to the ED after a near syncopal event. Apparently patient was walking to the nurse's station when he slumped due to weakness and was caught by staff.  There was no LOC, no head trauma.  He states that he felt weak, light headed and like he was going to faint.    Per the patient's son he has been living at [facility] for the past 1.5 months and has been doing well, was in his usual state of health until about one week ago when he was noted to be increasingly confused - using the cell phone to try to control TV, not calling son as often as usual.  He has been eating, drinking, ambulating and taking his medications as prescribed.  He denies any complaints of fever, chills, N/V/C/D, dysuria, frequency.    In ED, he was found to have a UTI and hypokalemia.  Admission to medicine was requested.  Son reports mentation improving following fluids in ED.    Past Medical History  Diagnosis Date  . Hard of hearing   . Hypertension   . Nerve pain   . CHF (congestive heart failure) ([place])   . GERD (gastroesophageal reflux disease)   . HLD (hyperlipidemia)   . A-fib [facility])     Past Surgical History  Procedure Laterality Date  . Colonoscopy    . Joint replacement      Family History  Problem Relation Age of Onset  . Hypertension    . Stroke    . Cancer    . Stroke Mother    Social History:  reports that he has quit smoking. He does not have any smokeless tobacco history on file. He reports that he drinks alcohol. He reports that he does not use illicit drugs.  Allergies: No Known Allergies   (Not in a hospital admission)  Results for orders placed or performed during the hospital encounter of 11/21/15 (from the past 48 hour(s))  Basic metabolic panel     Status: Abnormal   Collection Time: 11/21/15 10:22 PM  Result Value Ref Range     Sodium 140 135 - 145 mmol/L   Potassium 2.8 (L) 3.5 - 5.1 mmol/L   Chloride 105 101 - 111 mmol/L   CO2 26 22 - 32 mmol/L   Glucose, Bld 98 65 - 99 mg/dL   BUN 14 6 - 20 mg/dL   Creatinine, Ser 1.22 0.61 - 1.24 mg/dL   Calcium 8.7 (L) 8.9 - 10.3 mg/dL   GFR calc non Af Amer 54 (L) >60 mL/min   GFR calc Af Amer >60 >60 mL/min    Comment: (NOTE) The eGFR has been calculated using the CKD EPI equation. This calculation has not been validated in all clinical situations. eGFR's persistently <60 mL/min signify possible Chronic Kidney Disease.    Anion gap 9 5 - 15  CBC     Status: Abnormal   Collection Time: 11/21/15 10:22 PM  Result Value Ref Range   WBC 11.5 (H) 3.8 - 10.6 K/uL   RBC 5.12 4.40 - 5.90 MIL/uL   Hemoglobin 15.7 13.0 - 18.0 g/dL   HCT 45.3 40.0 - 52.0 %   MCV 88.5 80.0 - 100.0 fL   MCH 30.7 26.0 - 34.0 pg   MCHC 34.7 32.0 - 36.0 g/dL  RDW 14.7 (H) 11.5 - 14.5 %   Platelets 299 150 - 440 K/uL  Troponin I     Status: None   Collection Time: 11/21/15 10:22 PM  Result Value Ref Range   Troponin I <0.03 <0.03 ng/mL  Urinalysis complete, with microscopic     Status: Abnormal   Collection Time: 11/21/15 11:36 PM  Result Value Ref Range   Color, Urine YELLOW (A) YELLOW   APPearance CLEAR (A) CLEAR   Glucose, UA NEGATIVE NEGATIVE mg/dL   Bilirubin Urine NEGATIVE NEGATIVE   Ketones, ur NEGATIVE NEGATIVE mg/dL   Specific Gravity, Urine 1.021 1.005 - 1.030   Hgb urine dipstick NEGATIVE NEGATIVE   pH 5.0 5.0 - 8.0   Protein, ur NEGATIVE NEGATIVE mg/dL   Nitrite NEGATIVE NEGATIVE   Leukocytes, UA NEGATIVE NEGATIVE   RBC / HPF 0-5 0 - 5 RBC/hpf   WBC, UA TOO NUMEROUS TO COUNT 0 - 5 WBC/hpf   Bacteria, UA FEW (A) NONE SEEN   Squamous Epithelial / LPF 0-5 (A) NONE SEEN   WBC Clumps PRESENT    Mucous PRESENT    Dg Chest 2 View  11/21/2015  CLINICAL DATA:  Near syncopal episode at nursing home. EXAM: CHEST  2 VIEW COMPARISON:  Radiographs and CT 09/22/2015 FINDINGS:  Lung volumes are low. Unchanged mediastinal contours with aortic tortuosity and atherosclerosis. Chronic interstitial prominence without progression. Subsegmental atelectasis at the left lung base. Small pulmonary nodules on prior radiograph and CT are not as well visualized currently. No confluent airspace disease. No pleural effusion or pneumothorax. IMPRESSION: Chronic interstitial prominence.  No acute abnormality. Atherosclerosis of thoracic aorta. Electronically Signed   By: Jeb Levering M.D.   On: 11/21/2015 23:13   Ct Head Wo Contrast  11/22/2015  CLINICAL DATA:  Hypoxia, near syncope. Altered mental status. History of hypertension atrial fibrillation. EXAM: CT HEAD WITHOUT CONTRAST TECHNIQUE: Contiguous axial images were obtained from the base of the skull through the vertex without intravenous contrast. COMPARISON:  CT HEAD Sep 22, 2015 FINDINGS: INTRACRANIAL CONTENTS: The ventricles and sulci are normal for age. No intraparenchymal hemorrhage, mass effect nor midline shift. Patchy sick supratentorial white matter hypodensities. Old RIGHT basal ganglia lacunar infarct. Old RIGHT pontine lacunar infarct. No acute large vascular territory infarcts. No abnormal extra-axial fluid collections. Basal cisterns are patent. Mild calcific atherosclerosis of the carotid siphons. ORBITS: The included ocular globes and orbital contents are non-suspicious. Status post bilateral ocular lens implants. SINUSES: Similar severe RIGHT sphenoid sinusitis. Mastoid air cells are well aerated. SKULL/SOFT TISSUES: No skull fracture. No significant soft tissue swelling. IMPRESSION: No acute intracranial process. Stable involutional changes, moderate to severe chronic small vessel ischemic disease and old RIGHT basal ganglia lacunar infarct. Electronically Signed   By: Elon Alas M.D.   On: 11/22/2015 00:31   Ct Angio Chest Pe W/cm &/or Wo Cm  11/22/2015  CLINICAL DATA:  Hypoxia near syncope EXAM: CT ANGIOGRAPHY  CHEST WITH CONTRAST TECHNIQUE: Multidetector CT imaging of the chest was performed using the standard protocol during bolus administration of intravenous contrast. Multiplanar CT image reconstructions and MIPs were obtained to evaluate the vascular anatomy. CONTRAST:  100 mL of Isovue 370 COMPARISON:  CT of the chest Sep 04, 2015 FINDINGS: Central airways are normal. Nodules on the right lung on series 6, images 62 and 63 ye are ar stable measuring 6 and 5 mm. A 12 mm nodule anteriorly in the right lung on series 6, image 82 is stable as well. No  other suspicious nodules. No masses or focal infiltrates. Mild dependent atelectasis. Stable thyroid goiter. No effusions. The heart size is unchanged. There are coronary artery calcifications identified. Atherosclerosis is seen in the non aneurysmal thoracic aorta. No adenopathy in the chest. No pulmonary emboli. Stable adrenal nodularity, likely adenomas. No other changes in the upper abdomen. No acute bony abnormalities. Review of the MIP images confirms the above findings. IMPRESSION: 1. No pulmonary emboli. 2. Pulmonary nodularity, unchanged. See below for follow-up recommendations. Electronically Signed   By: Dorise Bullion III M.D   On: 11/22/2015 00:38    Review of Systems  Constitutional: Negative for fever, chills and diaphoresis.  HENT: Positive for tinnitus.   Eyes: Negative.   Respiratory: Negative.  Negative for cough and sputum production.   Cardiovascular: Negative.  Negative for chest pain and palpitations.  Gastrointestinal: Negative.   Genitourinary: Negative.  Negative for dysuria, urgency, frequency, hematuria and flank pain.  Musculoskeletal: Negative.   Neurological: Positive for dizziness and weakness.  Endo/Heme/Allergies: Negative.   Psychiatric/Behavioral: Negative.   All other systems reviewed and are negative.   Blood pressure 136/84, pulse 69, temperature 98.4 F (36.9 C), temperature source Oral, resp. rate 23, weight  102.513 kg (226 lb), SpO2 90 %. Physical Exam  Constitutional: He appears well-developed and well-nourished.  HENT:  Head: Normocephalic and atraumatic.  Eyes: Pupils are equal, round, and reactive to light.    Left eye with conjunctival injection.  Neck: Neck supple. No thyromegaly present.  Cardiovascular: Normal rate and regular rhythm.  Exam reveals no gallop and no friction rub.   No murmur heard. Respiratory: Effort normal and breath sounds normal. No respiratory distress. He has no wheezes. He has no rales.  GI: Soft. He exhibits no distension. There is no tenderness. There is no rebound and no guarding.  Neurological: He is alert.  Skin: Skin is warm and dry.  Psychiatric: He has a normal mood and affect.  A&Ox2, cranial nerves 2-12 are grossly intact with no focal sensorimotor deficits.      Assessment/Plan This is an 82 year old white male with medical problems as above who is now presenting with weakness, near-syncope, confusion likely secondary to UTI. Also with hypokalemia.    Admit to telemetry for monitoring.  IV Rocephin and follow up urine cultures.  Gentle IV fluids with KCl and recheck BMP in AM Check orthostatics Continue home medications.   Patient has a non-hospital DNR signed and copy is on chart.    [employer], DO 11/22/2015, 2:21 AM

## 2015-11-22 NOTE — Progress Notes (Addendum)
Patient is medically stable for D/C back to [facility]. Per RN patient is not requiring oxygen and is on room air. Per [employer] Med Tech at [facility] patient can return today and they will set up home health if the order is sent. Clinical Child psychotherapistocial Worker (CSW) sent D/C orders including home health order to ALF. RN will arrange EMS for transport. CSW contacted patient's son Onalee HuaDavid and left him a voicemail making him aware of above. Please reconsult if future social work needs arise. CSW signing off.   Patient's son Onalee HuaDavid called CSW back and was made aware of above. Son is in agreement with plan.   Baker Hughes IncorporatedBailey Nyaire Denbleyker, LCSW [phone]

## 2015-11-22 NOTE — Progress Notes (Signed)
Called EMS for transport to [facility]. Update given to [employer]

## 2015-11-22 NOTE — Clinical Social Work Note (Signed)
Clinical Social Work Assessment  Patient Details  Name: Daniel Barr MRN: [id number] Date of Birth: 1931-08-04  Date of referral:  11/22/15               Reason for consult:  Other (Comment Required) (From [facility] )                Permission sought to share information with:  Chartered certified accountant granted to share information::  Yes, Verbal Permission Granted  Name::      [facility]   Agency::     Relationship::     Contact Information:     Housing/Transportation Living arrangements for the past 2 months:  [employer] of Information:  Patient, Adult Children Patient Interpreter Needed:  None Criminal Activity/Legal Involvement Pertinent to Current Situation/Hospitalization:  No - Comment as needed Significant Relationships:  Adult Children Lives with:  Facility Resident Do you feel safe going back to the place where you live?  Yes Need for family participation in patient care:  Yes (Comment)  Care giving concerns:  Patient is a long term care resident at [facility] (fax: [phone])   Social Worker assessment / plan:  Clinical Social Worker (CSW) received referral that patient is from [facility]. CSW contacted [employer] and spoke with [facility] [employer]. Per Med Tech patient started out on the Memory Care side but has since moved to the ALF side. Patient has been on the ALF side for a month now. Per Med Tech patient is not on oxygen and walks with a walker at baseline. Per Med Tech patient can return. CSW met with patient and his son Shanon Brow was at bedside. Patient was pleasant and alert and oriented. CSW introduced self and explained role of CSW department. Per son him and his sister Milbert Coulter share HPOA. Per son he is going out of town tomorrow for 12 days on vacation however he will be able to be reached by phone. Son reported that patient does not require a heat healthy diet and can be on a regular diet.  Per son patient is in his 3's has dementia and does not need to be on special diet. CSW made RN aware of above. Patient and son are agreeable for patient to return to [facility].  FL2 complete. CSW requested PT consult to determine if patient is at baseline. CSW will continue to follow and assist as needed.    Employment status:  Retired Nurse, adult PT Recommendations:  Not assessed at this time Information / Referral to community resources:  Other (Comment Required) (Patient will return to ALF )  Patient/Family's Response to care:  Patient and son are agreeable for patient to return to ALF.   Patient/Family's Understanding of and Emotional Response to Diagnosis, Current Treatment, and Prognosis:  Patient and son were pleasant and thanked CSW for visit.   Emotional Assessment Appearance:  Appears stated age Attitude/Demeanor/Rapport:    Affect (typically observed):  Happy, Hopeful, Pleasant Orientation:  Oriented to Self, Fluctuating Orientation (Suspected and/or reported Sundowners), Oriented to Place Alcohol / Substance use:  Not Applicable Psych involvement (Current and /or in the community):  No (Comment)  Discharge Needs  Concerns to be addressed:  Discharge Planning Concerns Readmission within the last 30 days:  No Current discharge risk:  Cognitively Impaired Barriers to Discharge:  Continued Medical Work up   [employer], [employer], LCSW 11/22/2015, 10:07 AM

## 2015-11-22 NOTE — [place] FL2 (Signed)
[place] MEDICAID FL2 LEVEL OF CARE SCREENING TOOL     IDENTIFICATION  Patient Name: Daniel Barr Birthdate: 06/25/1931 Sex: male Admission Date (Current Location): 11/21/2015  [facility] [place]   Facility and Address:  [facility], [address], [place], [place] [address]      Provider Number: [id number]  Attending Physician Name and Address:  Milagros LollSrikar Sudini, MD  Relative Name and Phone Number:       Current Level of Care: Hospital Recommended Level of Care: Assisted Living Facility Prior Approval Number:    Date Approved/Denied:   PASRR Number:  [id number])  Discharge Plan: Domiciliary (Rest home)    Current Diagnoses: Patient Active Problem List   Diagnosis Date Noted  . UTI (urinary tract infection) 11/22/2015  . Acute encephalopathy 09/22/2015  . HTN (hypertension) 09/22/2015  . A-fib (HCC) 09/22/2015  . GERD (gastroesophageal reflux disease) 09/22/2015    Orientation RESPIRATION BLADDER Height & Weight     Self, Place  Room air  Continent Weight: 224 lb 11.2 oz (101.923 kg) Height:  5\' 11"  (180.3 cm)  BEHAVIORAL SYMPTOMS/MOOD NEUROLOGICAL BOWEL NUTRITION STATUS   (none )  (none ) Continent Diet (Diet: Heart Healthy )  AMBULATORY STATUS COMMUNICATION OF NEEDS Skin   Supervision Verbally Normal                       Personal Care Assistance Level of Assistance  Bathing, Feeding, Dressing Bathing Assistance: Limited assistance Feeding assistance: Limited assistance Dressing Assistance: Limited assistance     Functional Limitations Info  Sight, Hearing, Speech Sight Info: Adequate Hearing Info: Impaired Speech Info: Adequate    SPECIAL CARE FACTORS FREQUENCY  PT (By licensed PT)     PT Frequency:  (Home Health 2-3 times per week )              Contractures      Additional Factors Info  Code Status, Allergies Code Status Info:  (DNR ) Allergies Info:  (No Known Allergies)          Discharge Medications: Please see discharge summary for a list of discharge medications. Current Discharge Medication List    START taking these medications   Details  cephALEXin (KEFLEX) 500 MG capsule Take 1 capsule (500 mg total) by mouth 3 (three) times daily. Qty: 12 capsule, Refills: 0      CONTINUE these medications which have CHANGED   Details  potassium chloride SA (K-DUR,KLOR-CON) 20 MEQ tablet Take 2 tablets (40 mEq total) by mouth daily. Take with Torsemide. Qty: 60 tablet, Refills: 0      CONTINUE these medications which have NOT CHANGED   Details  acetaminophen (TYLENOL) 650 MG CR tablet Take 650 mg by mouth every 8 (eight) hours as needed for pain.    amLODipine (NORVASC) 10 MG tablet Take 1 tablet (10 mg total) by mouth daily. Qty: 30 tablet, Refills: 0    aspirin EC 81 MG tablet Take 1 tablet (81 mg total) by mouth daily. Qty: 30 tablet, Refills: 0    enalapril (VASOTEC) 20 MG tablet Take 1 tablet (20 mg total) by mouth 2 (two) times daily. Qty: 60 tablet, Refills: 0    erythromycin ophthalmic ointment Place 1 application into both eyes 2 (two) times daily.    hydrocortisone cream 1 % Apply 1 application topically daily. Apply to both legs for stasis dermatitis    loperamide (IMODIUM) 2 MG capsule Take 2 mg by mouth  as needed for diarrhea or loose stools.    omeprazole (PRILOSEC) 20 MG capsule Take 1 capsule (20 mg total) by mouth 2 (two) times daily. Qty: 30 capsule, Refills: 0    sotalol (BETAPACE) 80 MG tablet Take 1 tablet (80 mg total) by mouth 2 (two) times daily. Qty: 60 tablet, Refills: 0    tamsulosin (FLOMAX) 0.4 MG CAPS capsule Take 2 capsules (0.8 mg total) by mouth daily. Qty: 30 capsule, Refills: 0    torsemide (DEMADEX) 20 MG tablet Take 1 tablet (20 mg total) by mouth daily. Qty: 30 tablet, Refills: 0    LYRICA 150 MG capsule Take 1 capsule (150 mg total) by mouth 2 (two) times  daily as needed. For pain. Qty: 60 capsule, Refills: 0             Relevant Imaging Results:  Relevant Lab Results:   Additional Information  (SSN: [id number])  Tabor Bartram, Ladon Applebaum, LCSW

## 2015-11-22 NOTE — Discharge Instructions (Signed)
Resume prior diet and activity

## 2015-11-22 NOTE — [place] FL2 (Signed)
[place] MEDICAID FL2 LEVEL OF CARE SCREENING TOOL     IDENTIFICATION  Patient Name: Daniel Barr Birthdate: 05/21/1931 Sex: male Admission Date (Current Location): 11/21/2015  [facility] [place]   Facility and Address:  [facility], [address], [place], [place] [address]      Provider Number: [id number]  Attending Physician Name and Address:  Milagros LollSrikar Sudini, MD  Relative Name and Phone Number:       Current Level of Care: Hospital Recommended Level of Care: Assisted Living Facility Prior Approval Number:    Date Approved/Denied:   PASRR Number:  [id number])  Discharge Plan: Domiciliary (Rest home)    Current Diagnoses: Patient Active Problem List   Diagnosis Date Noted  . UTI (urinary tract infection) 11/22/2015  . Acute encephalopathy 09/22/2015  . HTN (hypertension) 09/22/2015  . A-fib (HCC) 09/22/2015  . GERD (gastroesophageal reflux disease) 09/22/2015    Orientation RESPIRATION BLADDER Height & Weight     Self, Place  O2 (2.5 Liters Oxygen ) Continent Weight: 224 lb 11.2 oz (101.923 kg) Height:  5\' 11"  (180.3 cm)  BEHAVIORAL SYMPTOMS/MOOD NEUROLOGICAL BOWEL NUTRITION STATUS   (none )  (none ) Continent Diet (Diet: Heart Healthy )  AMBULATORY STATUS COMMUNICATION OF NEEDS Skin   Supervision Verbally Normal                       Personal Care Assistance Level of Assistance  Bathing, Feeding, Dressing Bathing Assistance: Limited assistance Feeding assistance: Limited assistance Dressing Assistance: Limited assistance     Functional Limitations Info  Sight, Hearing, Speech Sight Info: Adequate Hearing Info: Impaired Speech Info: Adequate    SPECIAL CARE FACTORS FREQUENCY  PT (By licensed PT)     PT Frequency:  (Home Health 2-3 times per week )              Contractures      Additional Factors Info  Code Status, Allergies Code Status Info:  (DNR ) Allergies Info:  (No Known  Allergies)           Current Medications (11/22/2015):  This is the current hospital active medication list Current Facility-Administered Medications  Medication Dose Route Frequency Provider Last Rate Last Dose  . 0.9 % NaCl with KCl 40 mEq / L  infusion   Intravenous Continuous Srikar Sudini, MD      . acetaminophen (TYLENOL) suppository 650 mg  650 mg Rectal Q6H PRN Alexis Hugelmeyer, DO      . acetaminophen (TYLENOL) tablet 650 mg  650 mg Oral Q8H PRN Alexis Hugelmeyer, DO      . amLODipine (NORVASC) tablet 10 mg  10 mg Oral Daily Alexis Hugelmeyer, DO   10 mg at 11/22/15 0853  . aspirin EC tablet 81 mg  81 mg Oral Daily Alexis Hugelmeyer, DO   81 mg at 11/22/15 0853  . cefTRIAXone (ROCEPHIN) 1 g in dextrose 5 % 50 mL IVPB  1 g Intravenous Q24H Alexis Hugelmeyer, DO      . enalapril (VASOTEC) tablet 20 mg  20 mg Oral BID Alexis Hugelmeyer, DO   20 mg at 11/22/15 0853  . enoxaparin (LOVENOX) injection 40 mg  40 mg Subcutaneous Daily Alexis Hugelmeyer, DO   40 mg at 11/22/15 0419  . erythromycin ophthalmic ointment 1 application  1 application Both Eyes BID Alexis Hugelmeyer, DO   1 application at 11/22/15 0858  . HYDROcodone-acetaminophen (NORCO/VICODIN) 5-325 MG per tablet 1-2 tablet  1-2 tablet Oral Q4H PRN Alexis Hugelmeyer, DO      . hydrocortisone cream 1 % 1 application  1 application Topical Daily Alexis Hugelmeyer, DO      . loperamide (IMODIUM) capsule 2 mg  2 mg Oral PRN Alexis Hugelmeyer, DO      . pantoprazole (PROTONIX) EC tablet 40 mg  40 mg Oral QAC breakfast Alexis Hugelmeyer, DO   40 mg at 11/22/15 7829  . potassium chloride (KLOR-CON) packet 40 mEq  40 mEq Oral Q4H Srikar Sudini, MD      . Melene Muller ON 11/23/2015] potassium chloride SA (K-DUR,KLOR-CON) CR tablet 20 mEq  20 mEq Oral Daily Srikar Sudini, MD      . senna-docusate (Senokot-S) tablet 1 tablet  1 tablet Oral QHS PRN Alexis Hugelmeyer, DO      . sodium chloride flush (NS) 0.9 % injection 3 mL  3 mL Intravenous Q12H  Alexis Hugelmeyer, DO      . sotalol (BETAPACE) tablet 80 mg  80 mg Oral BID Alexis Hugelmeyer, DO   Stopped at 11/22/15 0321  . tamsulosin (FLOMAX) capsule 0.8 mg  0.8 mg Oral Daily Alexis Hugelmeyer, DO   0.8 mg at 11/22/15 5621  . torsemide (DEMADEX) tablet 20 mg  20 mg Oral Daily Alexis Hugelmeyer, DO   20 mg at 11/22/15 3086     Discharge Medications: Please see discharge summary for a list of discharge medications.  Relevant Imaging Results:  Relevant Lab Results:   Additional Information  (SSN: [id number])  Aarron Wierzbicki, Ladon Applebaum, LCSW

## 2015-11-22 NOTE — Evaluation (Signed)
Physical Therapy Evaluation Patient Details Name: Daniel Barr MRN: [id number] DOB: [birth year] Today's Date: 11/22/2015   History of Present Illness  Pt is an 82 yr old male with PMH of HTN, CHF, GERD, HLD, and aFib who presented to the ED after a near syncopal event at his ALF. Apparently pt was walking to the nurse's station when he slumped due to weakness and was caught by staff (- for LOC or trauma). Admitted with UTI and hypokalemia.    Clinical Impression  Pt with significant hearing loss and some confusion, thus PLOF difficult to ascertain. Per chart review, pt is from ALF and pt is ambulatory with RW at baseline. Currently, pt is mod I with bed mobility and CGA for transfers and ambulation x 61ft with RW. Pt has L eye conjunctivitis and is with c/o blurry vision. Pt presents with decreased safety d/t quickness/impulsivity with mobility, which I suspect is in part due to his inability to hear my instructions or see his periphery. Pt responds well to hand gestures in his immediate field of view. Pt would benefit from skilled PT to address noted impairments and functional limitations. Recommend pt discharge to ALF with supervision for mobility and HHPT when medically appropriate.     Follow Up Recommendations Home health PT;Supervision for mobility/OOB    Equipment Recommendations   (pt has RW)    Recommendations for Other Services       Precautions / Restrictions Precautions Precautions: Fall Restrictions Weight Bearing Restrictions: No      Mobility  Bed Mobility Overal bed mobility: Modified Independent General bed mobility comments: With [facility] elevated ~45 deg, pt able to achieve sit <> supine without physical assist. With cueing, pt is able to utilize UE's on trapeze bar to boost self up in bed.   Transfers Overall transfer level: Needs assistance Equipment used: Rolling walker (2 wheeled) Transfers: Sit to/from Stand Sit to Stand: Min guard General transfer comment: Pt  utilizes UE support on RW to achieve standing. CGA for safety. Additional time required. Able to march in place standing EOB at RW.  Ambulation/Gait Ambulation/Gait assistance: Min guard Ambulation Distance (Feet): 40 Feet Assistive device: Rolling walker (2 wheeled) Gait Pattern/deviations: WFL(Within Functional Limits);Step-through pattern;Decreased step length - right;Decreased step length - left Gait velocity: Quick General Gait Details: Pt able to ambulate with RW to door and back without significant gait deviations or LOB. Pt is very quick, requiring cues for slowing down/safety. CGA for safety.  Stairs    Wheelchair Mobility    Modified Rankin (Stroke Patients Only)       Balance Overall balance assessment: Needs assistance Sitting-balance support: Feet supported Sitting balance-Leahy Scale: Good Sitting balance - Comments: Able to maintain sitting balance without UE support while PT donned second gown   Standing balance support: Bilateral upper extremity supported (on RW) Standing balance-Leahy Scale: Fair Standing balance comment: Ambulates with RW and CGA      Pertinent Vitals/Pain Pain Assessment: No/denies pain    Home Living Family/patient expects to be discharged to:: Assisted living Home Equipment: Walker - 2 wheels    Prior Function Level of Independence: Independent with assistive device(s)  Comments: Pt incredibly HOH and slightly confused, thus difficult to ascertain true PLOF. From chart review, pt's son confirms that pt is ambulating with RW at baseline.     Hand Dominance        Extremity/Trunk Assessment   Upper Extremity Assessment: Overall WFL for tasks assessed   Lower Extremity Assessment: Generalized weakness  Cervical / Trunk Assessment: Normal    Communication   Communication: HOH  Cognition Arousal/Alertness: Awake/alert Behavior During Therapy: WFL for tasks assessed/performed;Impulsive (impulsivity potentially d/t inability  to hear directions) Overall Cognitive Status: Within Functional Limits for tasks assessed    General Comments General comments (skin integrity, edema, etc.): L eye red and watery; pt with c/o blurry vision  Nursing cleared pt for participation in physical therapy.  Pt agreeable to PT session.           Assessment/Plan    PT Assessment Patient needs continued PT services  PT Diagnosis Difficulty walking;Generalized weakness   PT Problem List Decreased strength;Decreased activity tolerance;Decreased balance;Decreased mobility;Decreased safety awareness  PT Treatment Interventions DME instruction;Gait training;Functional mobility training;Therapeutic activities;Balance training;Therapeutic exercise;Patient/family education   PT Goals (Current goals can be found in the Care Plan section) Acute Rehab PT Goals Patient Stated Goal: To get out of here PT Goal Formulation: With patient Time For Goal Achievement: 12/06/15 Potential to Achieve Goals: Good    Frequency Min 2X/week   Barriers to discharge           End of Session Equipment Utilized During Treatment: Gait belt Activity Tolerance: Patient tolerated treatment well Patient left: in bed;with call bell/phone within reach;with bed alarm set;with SCD's reapplied Nurse Communication: Mobility status;Precautions         Time: 7829-56211412-1433 PT Time Calculation (min) (ACUTE ONLY): 21 min   Charges:         PT G Codes:        [name], SPT 11/22/2015, 2:52 PM

## 2015-11-22 NOTE — Progress Notes (Signed)
rn spoke with dr sudini  Re: need for contact precautions. md orders d/c precautions

## 2015-11-22 NOTE — Discharge Summary (Addendum)
[facility]   PATIENT NAME: Li Fragoso    MR#:  [id number]  DATE OF BIRTH:  09/02/1931  DATE OF ADMISSION:  11/21/2015 ADMITTING PHYSICIAN: Katharina Caper, MD  DATE OF DISCHARGE: 11/22/2015  PRIMARY CARE PHYSICIAN: Derwood Kaplan, MD   ADMISSION DIAGNOSIS:  weakness  DISCHARGE DIAGNOSIS:  Active Problems:   UTI (urinary tract infection)   SECONDARY DIAGNOSIS:   Past Medical History  Diagnosis Date  . Hard of hearing   . Hypertension   . Nerve pain   . CHF (congestive heart failure) (HCC)   . GERD (gastroesophageal reflux disease)   . HLD (hyperlipidemia)   . A-fib [facility])      ADMITTING HISTORY  Chief Complaint: Weakness  HPI: Mr. Conde is an 82 year old white male with PMH of HTN, CHF, GERD, HLD, afib who presented to the ED after a near syncopal event. Apparently patient was walking to the nurse's station when he slumped due to weakness and was caught by staff. There was no LOC, no head trauma. He states that he felt weak, light headed and like he was going to faint.   Per the patient's son he has been living at [facility] for the past 1.5 months and has been doing well, was in his usual state of health until about one week ago when he was noted to be increasingly confused - using the cell phone to try to control TV, not calling son as often as usual. He has been eating, drinking, ambulating and taking his medications as prescribed. He denies any complaints of fever, chills, N/V/C/D, dysuria, frequency.   In ED, he was found to have a UTI and hypokalemia. Admission to medicine was requested. Son reports mentation improving following fluids in ED.   HOSPITAL COURSE:   * UTI Afebrile. Normal WBC. WIll treat with keflex for 4 more days  * Hypokalemia Add K supplements at discharge  * COPD.  Continue inhalers  * Dementia  Stable for discharge with home health PT  CONSULTS OBTAINED:     DRUG  ALLERGIES:  No Known Allergies  DISCHARGE MEDICATIONS:   Current Discharge Medication List    START taking these medications   Details  cephALEXin (KEFLEX) 500 MG capsule Take 1 capsule (500 mg total) by mouth 3 (three) times daily. Qty: 12 capsule, Refills: 0      CONTINUE these medications which have CHANGED   Details  potassium chloride SA (K-DUR,KLOR-CON) 20 MEQ tablet Take 2 tablets (40 mEq total) by mouth daily. Take with Torsemide. Qty: 60 tablet, Refills: 0      CONTINUE these medications which have NOT CHANGED   Details  acetaminophen (TYLENOL) 650 MG CR tablet Take 650 mg by mouth every 8 (eight) hours as needed for pain.    amLODipine (NORVASC) 10 MG tablet Take 1 tablet (10 mg total) by mouth daily. Qty: 30 tablet, Refills: 0    aspirin EC 81 MG tablet Take 1 tablet (81 mg total) by mouth daily. Qty: 30 tablet, Refills: 0    enalapril (VASOTEC) 20 MG tablet Take 1 tablet (20 mg total) by mouth 2 (two) times daily. Qty: 60 tablet, Refills: 0    erythromycin ophthalmic ointment Place 1 application into both eyes 2 (two) times daily.    hydrocortisone cream 1 % Apply 1 application topically daily. Apply to both legs for stasis dermatitis    loperamide (IMODIUM) 2 MG capsule Take 2 mg by  mouth as needed for diarrhea or loose stools.    omeprazole (PRILOSEC) 20 MG capsule Take 1 capsule (20 mg total) by mouth 2 (two) times daily. Qty: 30 capsule, Refills: 0    sotalol (BETAPACE) 80 MG tablet Take 1 tablet (80 mg total) by mouth 2 (two) times daily. Qty: 60 tablet, Refills: 0    tamsulosin (FLOMAX) 0.4 MG CAPS capsule Take 2 capsules (0.8 mg total) by mouth daily. Qty: 30 capsule, Refills: 0    torsemide (DEMADEX) 20 MG tablet Take 1 tablet (20 mg total) by mouth daily. Qty: 30 tablet, Refills: 0    LYRICA 150 MG capsule Take 1 capsule (150 mg total) by mouth 2 (two) times daily as needed. For pain. Qty: 60 capsule, Refills: 0        Today   VITAL  SIGNS:  Blood pressure 138/76, pulse 85, temperature 98.3 F (36.8 C), temperature source Oral, resp. rate 19, height 5\' 11"  (1.803 m), weight 101.923 kg (224 lb 11.2 oz), SpO2 97 %.  I/O:   Intake/Output Summary (Last 24 hours) at 11/22/15 1526 Last data filed at 11/22/15 1130  Gross per 24 hour  Intake      0 ml  Output    700 ml  Net   -700 ml    PHYSICAL EXAMINATION:  Physical Exam  GENERAL:  82 y.o.-year-old patient lying in the bed with no acute distress. Obese. Hard of hearing. LUNGS: Normal breath sounds bilaterally, no wheezing, rales,rhonchi or crepitation. No use of accessory muscles of respiration.  CARDIOVASCULAR: S1, S2 normal. No murmurs, rubs, or gallops.  ABDOMEN: Soft, non-tender, non-distended. Bowel sounds present. No organomegaly or mass.  NEUROLOGIC: Moves all 4 extremities. PSYCHIATRIC: The patient is alert and awake SKIN: No obvious rash, lesion, or ulcer.   DATA REVIEW:   CBC  Recent Labs Lab 11/22/15 0426  WBC 9.3  HGB 15.6  HCT 45.1  PLT 287    Chemistries   Recent Labs Lab 11/22/15 0426  NA 143  K 3.0*  CL 107  CO2 29  GLUCOSE 99  BUN 10  CREATININE 0.87  CALCIUM 8.7*  AST 19  ALT 13*  ALKPHOS 52  BILITOT 0.8    Cardiac Enzymes  Recent Labs Lab 11/21/15 2222  TROPONINI <0.03    Microbiology Results  Results for orders placed or performed during the hospital encounter of 11/21/15  MRSA PCR Screening     Status: None   Collection Time: 11/22/15  3:15 AM  Result Value Ref Range Status   MRSA by PCR NEGATIVE NEGATIVE Final    Comment:        The GeneXpert MRSA Assay (FDA approved for NASAL specimens only), is one component of a comprehensive MRSA colonization surveillance program. It is not intended to diagnose MRSA infection nor to guide or monitor treatment for MRSA infections.     RADIOLOGY:  Dg Chest 2 View  11/21/2015  CLINICAL DATA:  Near syncopal episode at nursing home. EXAM: CHEST  2 VIEW  COMPARISON:  Radiographs and CT 09/22/2015 FINDINGS: Lung volumes are low. Unchanged mediastinal contours with aortic tortuosity and atherosclerosis. Chronic interstitial prominence without progression. Subsegmental atelectasis at the left lung base. Small pulmonary nodules on prior radiograph and CT are not as well visualized currently. No confluent airspace disease. No pleural effusion or pneumothorax. IMPRESSION: Chronic interstitial prominence.  No acute abnormality. Atherosclerosis of thoracic aorta. Electronically Signed   By: Rubye Oaks M.D.   On: 11/21/2015 23:13  Ct Head Wo Contrast  11/22/2015  CLINICAL DATA:  Hypoxia, near syncope. Altered mental status. History of hypertension atrial fibrillation. EXAM: CT HEAD WITHOUT CONTRAST TECHNIQUE: Contiguous axial images were obtained from the base of the skull through the vertex without intravenous contrast. COMPARISON:  CT HEAD Sep 22, 2015 FINDINGS: INTRACRANIAL CONTENTS: The ventricles and sulci are normal for age. No intraparenchymal hemorrhage, mass effect nor midline shift. Patchy sick supratentorial white matter hypodensities. Old RIGHT basal ganglia lacunar infarct. Old RIGHT pontine lacunar infarct. No acute large vascular territory infarcts. No abnormal extra-axial fluid collections. Basal cisterns are patent. Mild calcific atherosclerosis of the carotid siphons. ORBITS: The included ocular globes and orbital contents are non-suspicious. Status post bilateral ocular lens implants. SINUSES: Similar severe RIGHT sphenoid sinusitis. Mastoid air cells are well aerated. SKULL/SOFT TISSUES: No skull fracture. No significant soft tissue swelling. IMPRESSION: No acute intracranial process. Stable involutional changes, moderate to severe chronic small vessel ischemic disease and old RIGHT basal ganglia lacunar infarct. Electronically Signed   By: Awilda Metro M.D.   On: 11/22/2015 00:31   Ct Angio Chest Pe W/cm &/or Wo Cm  11/22/2015  CLINICAL  DATA:  Hypoxia near syncope EXAM: CT ANGIOGRAPHY CHEST WITH CONTRAST TECHNIQUE: Multidetector CT imaging of the chest was performed using the standard protocol during bolus administration of intravenous contrast. Multiplanar CT image reconstructions and MIPs were obtained to evaluate the vascular anatomy. CONTRAST:  100 mL of Isovue 370 COMPARISON:  CT of the chest Sep 04, 2015 FINDINGS: Central airways are normal. Nodules on the right lung on series 6, images 62 and 63 ye are ar stable measuring 6 and 5 mm. A 12 mm nodule anteriorly in the right lung on series 6, image 82 is stable as well. No other suspicious nodules. No masses or focal infiltrates. Mild dependent atelectasis. Stable thyroid goiter. No effusions. The heart size is unchanged. There are coronary artery calcifications identified. Atherosclerosis is seen in the non aneurysmal thoracic aorta. No adenopathy in the chest. No pulmonary emboli. Stable adrenal nodularity, likely adenomas. No other changes in the upper abdomen. No acute bony abnormalities. Review of the MIP images confirms the above findings. IMPRESSION: 1. No pulmonary emboli. 2. Pulmonary nodularity, unchanged. See below for follow-up recommendations. Electronically Signed   By: Gerome Sam III M.D   On: 11/22/2015 00:38    Follow up with PCP in 1 week.  Management plans discussed with the patient, family and they are in agreement.  CODE STATUS:     Code Status Orders        Start     Ordered   11/22/15 0306  Do not attempt resuscitation (DNR)   Continuous    Question Answer Comment  In the event of cardiac or respiratory ARREST Do not call a "code blue"   In the event of cardiac or respiratory ARREST Do not perform Intubation, CPR, defibrillation or ACLS   In the event of cardiac or respiratory ARREST Use medication by any route, position, wound care, and other measures to relive pain and suffering. May use oxygen, suction and manual treatment of airway obstruction  as needed for comfort.      11/22/15 0305    Code Status History    Date Active Date Inactive Code Status Order ID Comments User Context   09/22/2015 10:22 PM 09/25/2015  4:46 PM DNR [id number]  Oralia Manis, MD Inpatient      TOTAL TIME TAKING CARE OF THIS PATIENT ON DAY OF DISCHARGE: more  than 30 minutes.   Milagros LollSudini, Johne Buckle R M.D on 11/22/2015 at 3:26 PM  Between 7am to 6pm - Pager - [phone]  After 6pm go to [email or web address] - password [email or web address] [facility]  [facility]  Office  [phone]  CC: Primary care physician; Derwood KaplanEason,  Ernest B, MD  Note: This dictation was prepared with Dragon dictation along with smaller phrase technology. Any transcriptional errors that result from this process are unintentional.

## 2015-11-22 NOTE — Care Management Note (Signed)
Case Management Note  Patient Details  Name: Daniel CureBilly R Barr MRN: [id number] Date of Birth: 11/03/1931  Subjective/Objective:                   Attempted to meet with patient but he has dementia and very hard of hearing. His son Daniel HuaDavid walked in as I was attempting to communicate with patient. I explained MOON letter in person that routine medications will not be covered. Daniel HuaDavid states that patient has been at [facility] for about two months. He has been ambulating with a front-wheeled walker. He is not on O2 at the facility.  Action/Plan: CSW referrral in place. RNCM will follow for discharge needs.   Expected Discharge Date:  11/24/15               Expected Discharge Plan:     In-House Referral:     Discharge planning Services     Post Acute Care Choice:    Choice offered to:  Patient, Adult Children  DME Arranged:    DME Agency:     HH Arranged:    HH Agency:     Status of Service:  In process, will continue to follow  If discussed at Long Length of Stay Meetings, dates discussed:    Additional Comments:  [name], RN 11/22/2015, 11:03 AM

## 2015-11-23 LAB — URINE CULTURE: Culture: 3000 — AB

## 2015-11-24 LAB — URINE CULTURE: CULTURE: NO GROWTH

## 2015-12-03 ENCOUNTER — Other Ambulatory Visit: Payer: Self-pay | Admitting: Nurse Practitioner

## 2015-12-03 DIAGNOSIS — R918 Other nonspecific abnormal finding of lung field: Secondary | ICD-10-CM

## 2015-12-10 ENCOUNTER — Ambulatory Visit: Payer: Medicare Other

## 2016-07-16 ENCOUNTER — Other Ambulatory Visit: Payer: Self-pay | Admitting: Specialist

## 2016-07-16 DIAGNOSIS — R918 Other nonspecific abnormal finding of lung field: Secondary | ICD-10-CM

## 2016-10-10 ENCOUNTER — Emergency Department: Payer: Medicare Other

## 2016-10-10 ENCOUNTER — Inpatient Hospital Stay
Admission: EM | Admit: 2016-10-10 | Discharge: 2016-10-13 | DRG: 194 | Disposition: A | Discharge status: Nursing Home (Includes ICF) | Payer: Medicare Other | Attending: Internal Medicine | Admitting: Internal Medicine

## 2016-10-10 ENCOUNTER — Encounter: Payer: Self-pay | Admitting: Emergency Medicine

## 2016-10-10 DIAGNOSIS — J189 Pneumonia, unspecified organism: Principal | ICD-10-CM | POA: Diagnosis present

## 2016-10-10 DIAGNOSIS — Z9981 Dependence on supplemental oxygen: Secondary | ICD-10-CM

## 2016-10-10 DIAGNOSIS — Z87891 Personal history of nicotine dependence: Secondary | ICD-10-CM

## 2016-10-10 DIAGNOSIS — E876 Hypokalemia: Secondary | ICD-10-CM | POA: Diagnosis present

## 2016-10-10 DIAGNOSIS — Z79899 Other long term (current) drug therapy: Secondary | ICD-10-CM

## 2016-10-10 DIAGNOSIS — N400 Enlarged prostate without lower urinary tract symptoms: Secondary | ICD-10-CM | POA: Diagnosis present

## 2016-10-10 DIAGNOSIS — J181 Lobar pneumonia, unspecified organism: Secondary | ICD-10-CM

## 2016-10-10 DIAGNOSIS — J440 Chronic obstructive pulmonary disease with acute lower respiratory infection: Secondary | ICD-10-CM | POA: Diagnosis present

## 2016-10-10 DIAGNOSIS — I4891 Unspecified atrial fibrillation: Secondary | ICD-10-CM | POA: Diagnosis present

## 2016-10-10 DIAGNOSIS — Z7982 Long term (current) use of aspirin: Secondary | ICD-10-CM

## 2016-10-10 DIAGNOSIS — Z66 Do not resuscitate: Secondary | ICD-10-CM | POA: Diagnosis present

## 2016-10-10 DIAGNOSIS — I110 Hypertensive heart disease with heart failure: Secondary | ICD-10-CM | POA: Diagnosis present

## 2016-10-10 DIAGNOSIS — K219 Gastro-esophageal reflux disease without esophagitis: Secondary | ICD-10-CM | POA: Diagnosis present

## 2016-10-10 DIAGNOSIS — D72829 Elevated white blood cell count, unspecified: Secondary | ICD-10-CM | POA: Diagnosis not present

## 2016-10-10 DIAGNOSIS — E785 Hyperlipidemia, unspecified: Secondary | ICD-10-CM | POA: Diagnosis present

## 2016-10-10 DIAGNOSIS — I509 Heart failure, unspecified: Secondary | ICD-10-CM | POA: Diagnosis present

## 2016-10-10 LAB — BASIC METABOLIC PANEL
Anion gap: 10 (ref 5–15)
BUN: 13 mg/dL (ref 6–20)
CHLORIDE: 107 mmol/L (ref 101–111)
CO2: 27 mmol/L (ref 22–32)
CREATININE: 1.11 mg/dL (ref 0.61–1.24)
Calcium: 8.6 mg/dL — ABNORMAL LOW (ref 8.9–10.3)
GFR calc non Af Amer: 60 mL/min — ABNORMAL LOW (ref >60)
Glucose, Bld: 89 mg/dL (ref 65–99)
POTASSIUM: 3.6 mmol/L (ref 3.5–5.1)
SODIUM: 144 mmol/L (ref 135–145)

## 2016-10-10 LAB — URINALYSIS, COMPLETE (UACMP) WITH MICROSCOPIC
BILIRUBIN URINE: NEGATIVE
Glucose, UA: NEGATIVE mg/dL
HGB URINE DIPSTICK: NEGATIVE
KETONES UR: NEGATIVE mg/dL
LEUKOCYTES UA: NEGATIVE
Nitrite: NEGATIVE
PROTEIN: 30 mg/dL — AB
Specific Gravity, Urine: 1.018 (ref 1.005–1.030)
pH: 5 (ref 5.0–8.0)

## 2016-10-10 LAB — CBC
HEMATOCRIT: 42.8 % (ref 40.0–52.0)
HEMOGLOBIN: 14.3 g/dL (ref 13.0–18.0)
MCH: 29.1 pg (ref 26.0–34.0)
MCHC: 33.3 g/dL (ref 32.0–36.0)
MCV: 87.2 fL (ref 80.0–100.0)
Platelets: 310 10*3/uL (ref 150–440)
RBC: 4.91 MIL/uL (ref 4.40–5.90)
RDW: 15.1 % — ABNORMAL HIGH (ref 11.5–14.5)
WBC: 18 10*3/uL — AB (ref 3.8–10.6)

## 2016-10-10 LAB — GLUCOSE, CAPILLARY: Glucose-Capillary: 137 mg/dL — ABNORMAL HIGH (ref 65–99)

## 2016-10-10 MED ORDER — AMLODIPINE BESYLATE 10 MG PO TABS
10.0000 mg | ORAL_TABLET | Freq: Every day | ORAL | Status: DC
  Administered 2016-10-11 – 2016-10-13 (×3): 10 mg via ORAL
  Filled 2016-10-10 (×3): qty 1

## 2016-10-10 MED ORDER — MAGNESIUM CITRATE PO SOLN
1.0000 | Freq: Once | ORAL | Status: DC | PRN
  Filled 2016-10-10: qty 296

## 2016-10-10 MED ORDER — DEXTROSE 5 % IV SOLN: 1.0000 g | INTRAVENOUS | Status: DC

## 2016-10-10 MED ORDER — ACETAMINOPHEN 325 MG PO TABS: 650.0000 mg | ORAL_TABLET | Freq: Four times a day (QID) | ORAL | Status: DC | PRN

## 2016-10-10 MED ORDER — DEXTROSE 5 % IV SOLN: 500.0000 mg | Freq: Once | INTRAVENOUS | Status: DC

## 2016-10-10 MED ORDER — SODIUM CHLORIDE 0.9 % IV BOLUS (SEPSIS)
500.0000 mL | Freq: Once | INTRAVENOUS | Status: AC
Start: 2016-10-10 — End: 2016-10-10
  Administered 2016-10-10: 500 mL via INTRAVENOUS

## 2016-10-10 MED ORDER — SENNOSIDES-DOCUSATE SODIUM 8.6-50 MG PO TABS: 1.0000 | ORAL_TABLET | Freq: Every evening | ORAL | Status: DC | PRN

## 2016-10-10 MED ORDER — DEXTROSE 5 % IV SOLN
1.0000 g | Freq: Once | INTRAVENOUS | Status: AC
  Administered 2016-10-10: 1 g via INTRAVENOUS
  Filled 2016-10-10: qty 10

## 2016-10-10 MED ORDER — ENALAPRIL MALEATE 10 MG PO TABS
20.0000 mg | ORAL_TABLET | Freq: Two times a day (BID) | ORAL | Status: DC
  Administered 2016-10-10 – 2016-10-13 (×6): 20 mg via ORAL
  Filled 2016-10-10 (×6): qty 2

## 2016-10-10 MED ORDER — TAMSULOSIN HCL 0.4 MG PO CAPS
0.8000 mg | ORAL_CAPSULE | Freq: Every day | ORAL | Status: DC
Start: 2016-10-11 — End: 2016-10-13
  Administered 2016-10-11 – 2016-10-13 (×3): 0.8 mg via ORAL
  Filled 2016-10-10 (×3): qty 2

## 2016-10-10 MED ORDER — MELATONIN 5 MG PO TABS
2.5000 mg | ORAL_TABLET | Freq: Every day | ORAL | Status: DC
  Administered 2016-10-10 – 2016-10-12 (×3): 2.5 mg via ORAL
  Filled 2016-10-10 (×4): qty 0.5

## 2016-10-10 MED ORDER — IPRATROPIUM BROMIDE 0.02 % IN SOLN: 0.5000 mg | Freq: Four times a day (QID) | RESPIRATORY_TRACT | Status: DC | PRN

## 2016-10-10 MED ORDER — ALBUTEROL SULFATE (2.5 MG/3ML) 0.083% IN NEBU
2.5000 mg | INHALATION_SOLUTION | Freq: Four times a day (QID) | RESPIRATORY_TRACT | Status: DC | PRN
  Administered 2016-10-13: 2.5 mg via RESPIRATORY_TRACT
  Filled 2016-10-10: qty 3

## 2016-10-10 MED ORDER — DEXTROSE 5 % IV SOLN
500.0000 mg | INTRAVENOUS | Status: DC
  Administered 2016-10-11: 500 mg via INTRAVENOUS
  Filled 2016-10-10 (×2): qty 500

## 2016-10-10 MED ORDER — LORATADINE 10 MG PO TABS
10.0000 mg | ORAL_TABLET | Freq: Every day | ORAL | Status: DC
  Administered 2016-10-11 – 2016-10-13 (×3): 10 mg via ORAL
  Filled 2016-10-10 (×3): qty 1

## 2016-10-10 MED ORDER — ONDANSETRON HCL 4 MG PO TABS: 4.0000 mg | ORAL_TABLET | Freq: Four times a day (QID) | ORAL | Status: DC | PRN

## 2016-10-10 MED ORDER — LOPERAMIDE HCL 2 MG PO CAPS: 2.0000 mg | ORAL_CAPSULE | ORAL | Status: DC | PRN

## 2016-10-10 MED ORDER — POTASSIUM CHLORIDE CRYS ER 20 MEQ PO TBCR
40.0000 meq | EXTENDED_RELEASE_TABLET | Freq: Every day | ORAL | Status: DC
  Administered 2016-10-11 – 2016-10-12 (×2): 40 meq via ORAL
  Filled 2016-10-10 (×2): qty 2

## 2016-10-10 MED ORDER — LORAZEPAM 0.5 MG PO TABS
0.5000 mg | ORAL_TABLET | Freq: Two times a day (BID) | ORAL | Status: DC | PRN
  Filled 2016-10-10 (×2): qty 1

## 2016-10-10 MED ORDER — ONDANSETRON HCL 4 MG/2ML IJ SOLN: 4.0000 mg | Freq: Four times a day (QID) | INTRAMUSCULAR | Status: DC | PRN

## 2016-10-10 MED ORDER — OXYCODONE HCL 5 MG PO TABS: 5.0000 mg | ORAL_TABLET | ORAL | Status: DC | PRN

## 2016-10-10 MED ORDER — ENOXAPARIN SODIUM 40 MG/0.4ML ~~LOC~~ SOLN
40.0000 mg | SUBCUTANEOUS | Status: DC
  Administered 2016-10-10 – 2016-10-12 (×3): 40 mg via SUBCUTANEOUS
  Filled 2016-10-10 (×3): qty 0.4

## 2016-10-10 MED ORDER — IOPAMIDOL (ISOVUE-300) INJECTION 61%
100.0000 mL | Freq: Once | INTRAVENOUS | Status: AC | PRN
  Administered 2016-10-10: 100 mL via INTRAVENOUS

## 2016-10-10 MED ORDER — DEXTROSE 5 % IV SOLN
2.0000 g | INTRAVENOUS | Status: DC
  Administered 2016-10-11 – 2016-10-12 (×2): 2 g via INTRAVENOUS
  Filled 2016-10-10 (×3): qty 2

## 2016-10-10 MED ORDER — ASPIRIN EC 81 MG PO TBEC
81.0000 mg | DELAYED_RELEASE_TABLET | Freq: Every day | ORAL | Status: DC
  Administered 2016-10-11 – 2016-10-13 (×3): 81 mg via ORAL
  Filled 2016-10-10 (×3): qty 1

## 2016-10-10 MED ORDER — SOTALOL HCL 80 MG PO TABS
80.0000 mg | ORAL_TABLET | Freq: Two times a day (BID) | ORAL | Status: DC
  Administered 2016-10-10 – 2016-10-13 (×6): 80 mg via ORAL
  Filled 2016-10-10 (×6): qty 1

## 2016-10-10 MED ORDER — DEXTROSE 5 % IV SOLN: 500.0000 mg | INTRAVENOUS | Status: DC

## 2016-10-10 MED ORDER — SODIUM CHLORIDE 0.9 % IV SOLN
INTRAVENOUS | Status: DC
  Administered 2016-10-10: 23:00:00 via INTRAVENOUS

## 2016-10-10 MED ORDER — TORSEMIDE 20 MG PO TABS
20.0000 mg | ORAL_TABLET | Freq: Every day | ORAL | Status: DC
  Administered 2016-10-11 – 2016-10-13 (×3): 20 mg via ORAL
  Filled 2016-10-10 (×3): qty 1

## 2016-10-10 MED ORDER — DEXTROSE 5 % IV SOLN
500.0000 mg | INTRAVENOUS | Status: DC
Start: 2016-10-10 — End: 2016-10-10

## 2016-10-10 MED ORDER — ACETAMINOPHEN 650 MG RE SUPP: 650.0000 mg | Freq: Four times a day (QID) | RECTAL | Status: DC | PRN

## 2016-10-10 MED ORDER — PREGABALIN 75 MG PO CAPS
75.0000 mg | ORAL_CAPSULE | Freq: Two times a day (BID) | ORAL | Status: DC
  Administered 2016-10-10 – 2016-10-13 (×6): 75 mg via ORAL
  Filled 2016-10-10 (×6): qty 1

## 2016-10-10 MED ORDER — BISACODYL 5 MG PO TBEC: 5.0000 mg | DELAYED_RELEASE_TABLET | Freq: Every day | ORAL | Status: DC | PRN

## 2016-10-10 MED ORDER — PANTOPRAZOLE SODIUM 40 MG PO TBEC
40.0000 mg | DELAYED_RELEASE_TABLET | Freq: Every day | ORAL | Status: DC
  Administered 2016-10-11 – 2016-10-13 (×3): 40 mg via ORAL
  Filled 2016-10-10 (×3): qty 1

## 2016-10-10 NOTE — H&P (Signed)
History and Physical   [facility] - [place] @ [facility] Admission History and Physical [employer], D.O.    Patient Name: Daniel Barr MR#: [id number] Date of Birth: 06-12-1931 Date of Admission: 10/10/2016  Referring MD/NP/PA: Dr. Scotty Court Primary Care Physician: Derwood Kaplan, MD Patient coming from: [facility] Outpatient Specialists: Dr. Lady Gary, Dr. Meredeth Ide   Chief Complaint:  Chief Complaint  Patient presents with  . Weakness  Please note the entire history is obtained from the patient's emergency department chart, emergency department provider and prior records. Patient's personal history is limited by dementia and altered mental status.   HPI: Daniel Barr is a 83 y.o. male with a known history of atrial fibrillation, CHF, home O2 dependent COPD, IBS, BPH, GERD, HLD, HTN presents to the emergency department for evaluation of weakness.  Patient was in a usual state of health until today when he was noted by caregivers to have increased weakness, decreased appetite.   Patient denies fevers/chills, weakness, dizziness, chest pain, shortness of breath, N/V/C/D, abdominal pain, dysuria/frequency, changes in mental status.   Otherwise there has been no change in status. Patient has been taking medication as prescribed and there has been no recent change in medication or diet.  There has been no recent hospitalizations, travel or sick contacts.    EMS/ED Course: Patient received Rocephin, Azithro. NS.  Medical admission was requested for management of RUL community acquired pneumonia.   Review of Systems: Patient denies all but is a poor historian.  CONSTITUTIONAL: No fever/chills, fatigue, weakness, weight gain/loss, headache. EYES: No blurry or double vision. ENT: No tinnitus, postnasal drip, redness or soreness of the oropharynx. RESPIRATORY: No cough, dyspnea, wheeze.  No hemoptysis.  CARDIOVASCULAR: No chest pain, palpitations, syncope, orthopnea. No lower extremity  edema.  GASTROINTESTINAL: No nausea, vomiting, abdominal pain, diarrhea, constipation.  No hematemesis, melena or hematochezia. GENITOURINARY: No dysuria, frequency, hematuria. ENDOCRINE: No polyuria or nocturia. No heat or cold intolerance. HEMATOLOGY: No anemia, bruising, bleeding. INTEGUMENTARY: No rashes, ulcers, lesions. MUSCULOSKELETAL: No arthritis, gout, dyspnea. NEUROLOGIC: No numbness, tingling, ataxia, seizure-type activity, weakness. PSYCHIATRIC: No anxiety, depression, insomnia.   Past Medical History:  Diagnosis Date  . A-fib (HCC)   . CHF (congestive heart failure) (HCC)   . GERD (gastroesophageal reflux disease)   . Hard of hearing   . HLD (hyperlipidemia)   . Hypertension   . Nerve pain     Past Surgical History:  Procedure Laterality Date  . COLONOSCOPY    . JOINT REPLACEMENT       reports that he has quit smoking. He has never used smokeless tobacco. He reports that he drinks alcohol. He reports that he does not use drugs.  No Known Allergies  Family History  Problem Relation Age of Onset  . Stroke Mother   . Hypertension Unknown   . Stroke Unknown   . Cancer Unknown     Prior to Admission medications   Medication Sig Start Date End Date Taking? Authorizing Provider  acetaminophen (TYLENOL) 650 MG CR tablet Take 650 mg by mouth every 8 (eight) hours as needed for pain.   Yes [provider]  amLODipine (NORVASC) 10 MG tablet Take 1 tablet (10 mg total) by mouth daily. 09/25/15  Yes Shaune Pollack, MD  aspirin EC 81 MG tablet Take 1 tablet (81 mg total) by mouth daily. 09/25/15  Yes Shaune Pollack, MD  enalapril (VASOTEC) 20 MG tablet Take 1 tablet (20 mg total) by mouth 2 (two) times daily. 09/25/15  Yes Shaune Pollack, MD  fexofenadine (ALLEGRA) 180 MG tablet Take 180 mg by mouth daily.   Yes [provider]  ibuprofen (ADVIL,MOTRIN) 400 MG tablet Take 400 mg by mouth 2 (two) times daily.   Yes [provider]  LORazepam (ATIVAN) 0.5 MG  tablet Take 0.5 mg by mouth 2 (two) times daily as needed for anxiety.   Yes [provider]  MELATONIN PO Take 3 mg by mouth at bedtime.   Yes [provider]  Multiple Vitamins-Minerals (PRESERVISION AREDS 2 PO) Take 1 capsule by mouth daily.   Yes [provider]  omeprazole (PRILOSEC) 20 MG capsule Take 1 capsule (20 mg total) by mouth 2 (two) times daily. 09/25/15  Yes Shaune Pollack, MD  potassium chloride SA (K-DUR,KLOR-CON) 20 MEQ tablet Take 2 tablets (40 mEq total) by mouth daily. Take with Torsemide. 11/22/15  Yes Sudini, Wardell Heath, MD  pregabalin (LYRICA) 75 MG capsule Take 75 mg by mouth 2 (two) times daily.   Yes [provider]  sotalol (BETAPACE) 80 MG tablet Take 1 tablet (80 mg total) by mouth 2 (two) times daily. 09/25/15  Yes Shaune Pollack, MD  tamsulosin (FLOMAX) 0.4 MG CAPS capsule Take 2 capsules (0.8 mg total) by mouth daily. 09/25/15  Yes Shaune Pollack, MD  torsemide (DEMADEX) 20 MG tablet Take 1 tablet (20 mg total) by mouth daily. 09/25/15  Yes Shaune Pollack, MD  cephALEXin (KEFLEX) 500 MG capsule Take 1 capsule (500 mg total) by mouth 3 (three) times daily. 11/22/15   Milagros Loll, MD  loperamide (IMODIUM) 2 MG capsule Take 2 mg by mouth as needed for diarrhea or loose stools.    [provider]  LYRICA 150 MG capsule Take 1 capsule (150 mg total) by mouth 2 (two) times daily as needed. For pain. Patient not taking: Reported on 10/10/2016 09/25/15   Shaune Pollack, MD    Physical Exam: Vitals:   10/10/16 1800 10/10/16 1830 10/10/16 1930 10/10/16 2000  BP: (!) 158/80 (!) 152/81    Pulse: 61 (!) 57 (!) 56 63  Resp:   16 19  Temp:      TempSrc:      SpO2: 95% 95% 94% 94%  Weight:      Height:        GENERAL: 83 y.o.-year-old male patient, well-developed, well-nourished lying in the bed in no acute distress.  Pleasantly confused. HEENT: Head atraumatic, normocephalic. Pupils equal, round, reactive to light and accommodation. No scleral icterus.  Extraocular muscles intact. Nares are patent. Oropharynx is clear. Mucus membranes dry. NECK: Supple, full range of motion. No JVD, no bruit heard. No thyroid enlargement, no tenderness, no cervical lymphadenopathy. CHEST: Normal breath sounds bilaterally. No wheezing, rales, rhonchi or crackles. No use of accessory muscles of respiration.  No reproducible chest wall tenderness.  CARDIOVASCULAR: S1, S2 normal. No murmurs, rubs, or gallops. Cap refill <2 seconds. Pulses intact distally.  ABDOMEN: Soft, nondistended, nontender. No rebound, guarding, rigidity. Normoactive bowel sounds present in all four quadrants. No organomegaly or mass. EXTREMITIES: No pedal edema, cyanosis, or clubbing. No calf tenderness or Homan's sign.  NEUROLOGIC: The patient is alert and oriented x 3. Cranial nerves II through XII are grossly intact with no focal sensorimotor deficit. Muscle strength 5/5 in all extremities. Sensation intact. Gait not checked. PSYCHIATRIC:  Normal affect, mood, thought content. SKIN: Warm, dry, and intact without obvious rash, lesion, or ulcer.    Labs on Admission:  CBC:  Recent Labs Lab 10/10/16 1638  WBC 18.0*  HGB 14.3  HCT 42.8  MCV 87.2  PLT 310   Basic Metabolic Panel:  Recent Labs Lab 10/10/16 1638  NA 144  K 3.6  CL 107  CO2 27  GLUCOSE 89  BUN 13  CREATININE 1.11  CALCIUM 8.6*   GFR: Estimated Creatinine Clearance: 57.8 mL/min (by C-G formula based on SCr of 1.11 mg/dL). Liver Function Tests: No results for input(s): AST, ALT, ALKPHOS, BILITOT, PROT, ALBUMIN in the last 168 hours. No results for input(s): LIPASE, AMYLASE in the last 168 hours. No results for input(s): AMMONIA in the last 168 hours. Coagulation Profile: No results for input(s): INR, PROTIME in the last 168 hours. Cardiac Enzymes: No results for input(s): CKTOTAL, CKMB, CKMBINDEX, TROPONINI in the last 168 hours. BNP (last 3 results) No results for input(s): PROBNP in the last 8760  hours. HbA1C: No results for input(s): HGBA1C in the last 72 hours. CBG: No results for input(s): GLUCAP in the last 168 hours. Lipid Profile: No results for input(s): CHOL, HDL, LDLCALC, TRIG, CHOLHDL, LDLDIRECT in the last 72 hours. Thyroid Function Tests: No results for input(s): TSH, T4TOTAL, FREET4, T3FREE, THYROIDAB in the last 72 hours. Anemia Panel: No results for input(s): VITAMINB12, FOLATE, FERRITIN, TIBC, IRON, RETICCTPCT in the last 72 hours. Urine analysis:    Component Value Date/Time   COLORURINE YELLOW (A) 10/10/2016 1638   APPEARANCEUR HAZY (A) 10/10/2016 1638   LABSPEC 1.018 10/10/2016 1638   PHURINE 5.0 10/10/2016 1638   GLUCOSEU NEGATIVE 10/10/2016 1638   HGBUR NEGATIVE 10/10/2016 1638   BILIRUBINUR NEGATIVE 10/10/2016 1638   KETONESUR NEGATIVE 10/10/2016 1638   PROTEINUR 30 (A) 10/10/2016 1638   NITRITE NEGATIVE 10/10/2016 1638   LEUKOCYTESUR NEGATIVE 10/10/2016 1638   Sepsis Labs: @LABRCNTIP (procalcitonin:4,lacticidven:4) )No results found for this or any previous visit (from the past 240 hour(s)).   Radiological Exams on Admission: Dg Chest 2 View  Result Date: 10/10/2016 CLINICAL DATA:  Shortness of breath and weakness EXAM: CHEST  2 VIEW COMPARISON:  Chest CT and chest radiograph 11/21/2015 FINDINGS: Shallow lung inflation. Unchanged cardiomegaly with calcification in the aortic arch. There is mild interstitial edema. No focal consolidation. No pleural effusion or pneumothorax. IMPRESSION: Mild interstitial pulmonary edema with unchanged cardiomegaly and aortic atherosclerosis. Electronically Signed   By: Deatra RobinsonKevin  Herman M.D.   On: 10/10/2016 16:57   Ct Chest W Contrast  Result Date: 10/10/2016 CLINICAL DATA:  Low-grade fever, leukocytosis and generalized weakness. EXAM: CT CHEST, ABDOMEN, AND PELVIS WITH CONTRAST TECHNIQUE: Multidetector CT imaging of the chest, abdomen and pelvis was performed following the standard protocol during bolus administration of  intravenous contrast. CONTRAST:  100mL ISOVUE-300 IOPAMIDOL (ISOVUE-300) INJECTION 61% COMPARISON:  Chest CT exams dating back through 09/22/2015. FINDINGS: CT CHEST FINDINGS Cardiovascular: Borderline cardiomegaly with coronary arteriosclerosis along the left main, lad, RCA and circumflex. No pericardial effusion. Aortic atherosclerosis without aneurysmal dilatation or dissection. No large central pulmonary embolus is noted. Mediastinum/Nodes: Substernal extension of the thyroid gland with stable appearing benign-appearing subcentimeter hypodensities consistent with tiny nodules. Coarse calcification is seen a right internal thyroid gland. No mediastinal or hilar adenopathy. Trachea and mainstem bronchi as well as esophagus are normal in appearance. Lungs/Pleura: Stable right upper and right middle lobe nodules are noted, the right upper lobe nodules measuring between 5 and 6 mm on series 4 image 70 and 71 with right middle lobe subpleural nodules measuring 5 and 11 mm in maximum dimension, series 4, image 91 and 94. Centrilobular emphysema is noted  in the upper lobes with superimposed airspace disease in the right upper lobe suspicious for concomitant pneumonia. Dependent atelectasis with subsegmental atelectasis noted in both lower lobes and lingula. No effusion or pneumothorax. Musculoskeletal: No chest wall mass or suspicious bone lesions identified. CT ABDOMEN PELVIS FINDINGS Hepatobiliary: No focal liver abnormality is seen. No gallstones, gallbladder wall thickening, or biliary dilatation. Pancreas: Unremarkable. No pancreatic ductal dilatation or surrounding inflammatory changes. Spleen: Normal in size without focal abnormality. Adrenals/Urinary Tract: Hypodense 26, 18 and 11 nodules of the left adrenal gland are identified with Hounsfield units consistent with benign adenomas. Stable mild nodular thickening of the right adrenal gland. Mild bilateral renal cortical thinning. 12 mm water attenuating  exophytic cyst off the interpolar right kidney. No nephrolithiasis, hydronephrosis nor hydroureter. Urinary bladder is unremarkable. Stomach/Bowel: The stomach is contracted. There is normal small bowel rotation without small-bowel obstruction or inflammation. The appendix is not visualized but no right lower quadrant or pericecal inflammation is seen. A moderate amount of stool noted within large bowel. Scattered colonic diverticulosis is noted without acute diverticulitis. Vascular/Lymphatic: Aortoiliac and branch vessel atherosclerosis. No aneurysm or dissection. Patent portal and splenic veins. Reproductive: Enlarged prostate measuring 5.1 x 6.4 x 5.3  cm. Other: Status post right inguinal hernia repair. Musculoskeletal: Degenerative disc disease at L5-S1. Right hip arthroplasty. No acute nor suspicious osseous lesions. IMPRESSION: 1. Stable right upper and right middle lobe subpleural pulmonary nodules largest is approximately 11 mm in the right middle lobe unchanged since 09/22/2015 CT. Given stability of the nodules, a future CT at 18-24 months (from initial scan performed 09/22/2015) is considered optional for low-risk patients, but is recommended for high-risk patients. This recommendation follows the consensus statement: Guidelines for Management of Incidental Pulmonary Nodules Detected on CT Images: From the Fleischner Society 2017; Radiology 2017; 284:228-243. 2. COPD with superimposed airspace disease in the right upper lobe suspicious for pneumonia. 3. Mild thyromegaly with tiny subcentimeter hypodense nodules without worrisome features, stable in appearance. 4. Stable left-sided adrenal nodules with enhancement consistent with benign adenomas. 5. Coronary arteriosclerosis and aortic atherosclerosis. 6. Tiny water attenuating 12 mm cyst off the interpolar right kidney. 7. Colonic diverticulosis without acute diverticulitis. No bowel inflammation or obstruction. 8. Enlarged prostate. 9. Status post right  inguinal hernia repair and right hip arthroplasty Electronically Signed   By: Tollie Eth M.D.   On: 10/10/2016 19:20   Ct Abdomen Pelvis W Contrast  Result Date: 10/10/2016 CLINICAL DATA:  Low-grade fever, leukocytosis and generalized weakness. EXAM: CT CHEST, ABDOMEN, AND PELVIS WITH CONTRAST TECHNIQUE: Multidetector CT imaging of the chest, abdomen and pelvis was performed following the standard protocol during bolus administration of intravenous contrast. CONTRAST:  ISOVUE-300 IOPAMIDOL (ISOVUE-300) INJECTION 61% COMPARISON:  Chest CT exams dating back through 09/22/2015. FINDINGS: CT CHEST FINDINGS Cardiovascular: Borderline cardiomegaly with coronary arteriosclerosis along the left main, lad, RCA and circumflex. No pericardial effusion. Aortic atherosclerosis without aneurysmal dilatation or dissection. No large central pulmonary embolus is noted. Mediastinum/Nodes: Substernal extension of the thyroid gland with stable appearing benign-appearing subcentimeter hypodensities consistent with tiny nodules. Coarse calcification is seen a right internal thyroid gland. No mediastinal or hilar adenopathy. Trachea and mainstem bronchi as well as esophagus are normal in appearance. Lungs/Pleura: Stable right upper and right middle lobe nodules are noted, the right upper lobe nodules measuring between 5 and 6 mm on series 4 image 70 and 71 with right middle lobe subpleural nodules measuring 5 and 11 mm in maximum dimension, series 4, image 91  and 94. Centrilobular emphysema is noted in the upper lobes with superimposed airspace disease in the right upper lobe suspicious for concomitant pneumonia. Dependent atelectasis with subsegmental atelectasis noted in both lower lobes and lingula. No effusion or pneumothorax. Musculoskeletal: No chest wall mass or suspicious bone lesions identified. CT ABDOMEN PELVIS FINDINGS Hepatobiliary: No focal liver abnormality is seen. No gallstones, gallbladder wall thickening, or  biliary dilatation. Pancreas: Unremarkable. No pancreatic ductal dilatation or surrounding inflammatory changes. Spleen: Normal in size without focal abnormality. Adrenals/Urinary Tract: Hypodense 26, 18 and 11 nodules of the left adrenal gland are identified with Hounsfield units consistent with benign adenomas. Stable mild nodular thickening of the right adrenal gland. Mild bilateral renal cortical thinning. 12 mm water attenuating exophytic cyst off the interpolar right kidney. No nephrolithiasis, hydronephrosis nor hydroureter. Urinary bladder is unremarkable. Stomach/Bowel: The stomach is contracted. There is normal small bowel rotation without small-bowel obstruction or inflammation. The appendix is not visualized but no right lower quadrant or pericecal inflammation is seen. A moderate amount of stool noted within large bowel. Scattered colonic diverticulosis is noted without acute diverticulitis. Vascular/Lymphatic: Aortoiliac and branch vessel atherosclerosis. No aneurysm or dissection. Patent portal and splenic veins. Reproductive: Enlarged prostate measuring 5.1 x 6.4 x 5.3  cm. Other: Status post right inguinal hernia repair. Musculoskeletal: Degenerative disc disease at L5-S1. Right hip arthroplasty. No acute nor suspicious osseous lesions. IMPRESSION: 1. Stable right upper and right middle lobe subpleural pulmonary nodules largest is approximately 11 mm in the right middle lobe unchanged since 09/22/2015 CT. Given stability of the nodules, a future CT at 18-24 months (from initial scan performed 09/22/2015) is considered optional for low-risk patients, but is recommended for high-risk patients. This recommendation follows the consensus statement: Guidelines for Management of Incidental Pulmonary Nodules Detected on CT Images: From the Fleischner Society 2017; Radiology 2017; 284:228-243. 2. COPD with superimposed airspace disease in the right upper lobe suspicious for pneumonia. 3. Mild thyromegaly with  tiny subcentimeter hypodense nodules without worrisome features, stable in appearance. 4. Stable left-sided adrenal nodules with enhancement consistent with benign adenomas. 5. Coronary arteriosclerosis and aortic atherosclerosis. 6. Tiny water attenuating 12 mm cyst off the interpolar right kidney. 7. Colonic diverticulosis without acute diverticulitis. No bowel inflammation or obstruction. 8. Enlarged prostate. 9. Status post right inguinal hernia repair and right hip arthroplasty Electronically Signed   By: Tollie Eth M.D.   On: 10/10/2016 19:20    EKG: Normal sinus rhythm at 71 bpm with normal axis and nonspecific ST-T wave changes.   Assessment/Plan  This is a 83 y.o. male with a history of atrial fibrillation, CHF, home O2 dependent COPD, IBS, BPH, GERD, HLD, HTN now being admitted with:  #. Community Acquired Pneumonia, right upper lobe - Admit to inpatient - IV Rocephin & Azithromycin per pharmacy - IV fluid hydration - Duonebs, expectorants & O2 therapy as needed - Follow up blood & sputum cultures  #. Pulmonary nodules, stable since 09/2015 - Outpatient followup.   #. COPD - O2, mednebs as needed  #. History of HTN - Continue Norvasc, enalapril  #. History of CHF - Continue enalapril, torsemide  #. History of afib - Continue sotalol  #. History of GERD - Continue Protonix for Prilosec  #. History of BPH - Continue Flomax  Admission status: Observation IV Fluids: NS Diet/Nutrition: Heart healthy Consults called: None  DVT Px: Lovenox, SCDs and early ambulation. Code Status: DNR - comes with nonhospital yellow DNR form.  Disposition Plan: To home in  1-2 days  All the records are reviewed and case discussed with ED provider.  Charlette Hennings D.O. on 10/10/2016 at 8:35 PM Between 7am to 6pm - Pager - [phone] After 6pm go to [email or web address] - Social research officer, government [facility] [place] [facility] Office [phone] CC: Primary care physician; Derwood Kaplan, MD   10/10/2016, 8:35 PM

## 2016-10-10 NOTE — ED Provider Notes (Signed)
[facility] Emergency Department Provider Note  ____________________________________________  Time seen: Approximately 4:33 PM  I have reviewed the triage vital signs and the nursing notes.   HISTORY  Chief Complaint Weakness Level 5 caveat:  Portions of the history and physical were unable to be obtained due to the patient's altered mental status from chronic dementia.   HPI Daniel Barr is a 83 y.o. male sent to the ED due to generalized weakness. He is ambulatory with his walker at baseline, using his baseline oxygen 3 L nasal cannula, but staff at [facility] felt like he was weaker today. Patient says he does not feel sick and is in his usual state of health without acute symptoms. As report that he has not been eating as much recently and he does not know why.     Past Medical History:  Diagnosis Date  . A-fib (HCC)   . CHF (congestive heart failure) (HCC)   . GERD (gastroesophageal reflux disease)   . Hard of hearing   . HLD (hyperlipidemia)   . Hypertension   . Nerve pain      Patient Active Problem List   Diagnosis Date Noted  . UTI (urinary tract infection) 11/22/2015  . Acute encephalopathy 09/22/2015  . HTN (hypertension) 09/22/2015  . A-fib (HCC) 09/22/2015  . GERD (gastroesophageal reflux disease) 09/22/2015     Past Surgical History:  Procedure Laterality Date  . COLONOSCOPY    . JOINT REPLACEMENT       Prior to Admission medications   Medication Sig Start Date End Date Taking? Authorizing Provider  acetaminophen (TYLENOL) 650 MG CR tablet Take 650 mg by mouth every 8 (eight) hours as needed for pain.   Yes [provider]  amLODipine (NORVASC) 10 MG tablet Take 1 tablet (10 mg total) by mouth daily. 09/25/15  Yes Shaune Pollackhen, Qing, MD  aspirin EC 81 MG tablet Take 1 tablet (81 mg total) by mouth daily. 09/25/15  Yes Shaune Pollackhen, Qing, MD  enalapril (VASOTEC) 20 MG tablet Take 1 tablet (20 mg total) by mouth 2 (two) times  daily. 09/25/15  Yes Shaune Pollackhen, Qing, MD  fexofenadine (ALLEGRA) 180 MG tablet Take 180 mg by mouth daily.   Yes [provider]  ibuprofen (ADVIL,MOTRIN) 400 MG tablet Take 400 mg by mouth 2 (two) times daily.   Yes [provider]  LORazepam (ATIVAN) 0.5 MG tablet Take 0.5 mg by mouth 2 (two) times daily as needed for anxiety.   Yes [provider]  MELATONIN PO Take 3 mg by mouth at bedtime.   Yes [provider]  Multiple Vitamins-Minerals (PRESERVISION AREDS 2 PO) Take 1 capsule by mouth daily.   Yes [provider]  omeprazole (PRILOSEC) 20 MG capsule Take 1 capsule (20 mg total) by mouth 2 (two) times daily. 09/25/15  Yes Shaune Pollackhen, Qing, MD  potassium chloride SA (K-DUR,KLOR-CON) 20 MEQ tablet Take 2 tablets (40 mEq total) by mouth daily. Take with Torsemide. 11/22/15  Yes Sudini, Wardell HeathSrikar, MD  pregabalin (LYRICA) 75 MG capsule Take 75 mg by mouth 2 (two) times daily.   Yes [provider]  sotalol (BETAPACE) 80 MG tablet Take 1 tablet (80 mg total) by mouth 2 (two) times daily. 09/25/15  Yes Shaune Pollackhen, Qing, MD  tamsulosin (FLOMAX) 0.4 MG CAPS capsule Take 2 capsules (0.8 mg total) by mouth daily. 09/25/15  Yes Shaune Pollackhen, Qing, MD  torsemide (DEMADEX) 20 MG tablet Take 1 tablet (20 mg total) by mouth daily. 09/25/15  Yes Shaune Pollack, MD  cephALEXin (KEFLEX) 500 MG capsule Take 1 capsule (500 mg total) by mouth 3 (three) times daily. 11/22/15   Milagros Loll, MD  loperamide (IMODIUM) 2 MG capsule Take 2 mg by mouth as needed for diarrhea or loose stools.    [provider]  LYRICA 150 MG capsule Take 1 capsule (150 mg total) by mouth 2 (two) times daily as needed. For pain. Patient not taking: Reported on 10/10/2016 09/25/15   Shaune Pollack, MD     Allergies Patient has no known allergies.   Family History  Problem Relation Age of Onset  . Stroke Mother   . Hypertension Unknown   . Stroke Unknown   . Cancer Unknown     Social History Social History   Substance Use Topics  . Smoking status: Former Games developer  . Smokeless tobacco: Never Used  . Alcohol use Yes    Review of Systems  Constitutional:   No fever or chills.  ENT:   No sore throat. No rhinorrhea. Cardiovascular:   No chest pain or syncope. Respiratory:   No dyspnea or cough. Gastrointestinal:   Negative for abdominal pain, vomiting and diarrhea.  Musculoskeletal:   Negative for focal pain or swelling All other systems reviewed and are negative except as documented above in ROS and HPI.  ____________________________________________   PHYSICAL EXAM:  VITAL SIGNS: ED Triage Vitals  Enc Vitals Group     BP 10/10/16 1616 133/72     Pulse Rate 10/10/16 1616 70     Resp 10/10/16 1616 18     Temp 10/10/16 1616 99 F (37.2 C)     Temp Source 10/10/16 1616 Oral     SpO2 10/10/16 1616 90 %     Weight 10/10/16 1620 220 lb (99.8 kg)     Height 10/10/16 1620 5\' 7"  (1.702 m)     Head Circumference --      Peak Flow --      Pain Score --      Pain Loc --      Pain Edu? --      Excl. in GC? --     Vital signs reviewed, nursing assessments reviewed.   Constitutional:   Alert and orientedTo self. Well appearing and in no distress. Eyes:   No scleral icterus.  EOMI. No nystagmus. No conjunctival pallor. PERRL. ENT   Head:   Normocephalic and atraumatic.   Nose:   No congestion/rhinnorhea.    Mouth/Throat:   Dry mucous membranes, no pharyngeal erythema. No peritonsillar mass.    Neck:   No meningismus. Full ROM Hematological/Lymphatic/Immunilogical:   No cervical lymphadenopathy. Cardiovascular:   RRR. Symmetric bilateral radial and DP pulses.  No murmurs.  Respiratory:   Normal respiratory effort without tachypnea/retractions. Breath sounds are clear and equal bilaterally. No wheezes/rales/rhonchi. Gastrointestinal:   Soft and nontender. Non distended. There is no CVA tenderness.  No rebound, rigidity, or guarding. Genitourinary:   deferred Musculoskeletal:    Normal range of motion in all extremities. No joint effusions.  No lower extremity tenderness.  No edema. Neurologic:   Normal speech and language.  Motor grossly intact. No gross focal neurologic deficits are appreciated.  Skin:    Skin is warm, dry and intact. No rash noted.  No petechiae, purpura, or bullae.  ____________________________________________    LABS (pertinent positives/negatives) (all labs ordered are listed, but only abnormal results are displayed) Labs Reviewed  BASIC METABOLIC PANEL - Abnormal; Notable for the following:  Result Value   Calcium 8.6 (*)    GFR calc non Af Amer 60 (*)    All other components within normal limits  CBC - Abnormal; Notable for the following:    WBC 18.0 (*)    RDW 15.1 (*)    All other components within normal limits  URINALYSIS, COMPLETE (UACMP) WITH MICROSCOPIC - Abnormal; Notable for the following:    Color, Urine YELLOW (*)    APPearance HAZY (*)    Protein, ur 30 (*)    Bacteria, UA RARE (*)    Squamous Epithelial / LPF 0-5 (*)    All other components within normal limits  URINE CULTURE  CBG MONITORING, ED   ____________________________________________   EKG  Interpreted by me  Date: 10/10/2016  Rate: 71  Rhythm: normal sinus rhythm  QRS Axis: normal  Intervals: normal  ST/T Wave abnormalities: normal  Conduction Disutrbances: none  Narrative Interpretation: unremarkable      ____________________________________________    RADIOLOGY  Dg Chest 2 View  Result Date: 10/10/2016 CLINICAL DATA:  Shortness of breath and weakness EXAM: CHEST  2 VIEW COMPARISON:  Chest CT and chest radiograph 11/21/2015 FINDINGS: Shallow lung inflation. Unchanged cardiomegaly with calcification in the aortic arch. There is mild interstitial edema. No focal consolidation. No pleural effusion or pneumothorax. IMPRESSION: Mild interstitial pulmonary edema with unchanged cardiomegaly and aortic atherosclerosis. Electronically  Signed   By: Deatra Robinson M.D.   On: 10/10/2016 16:57   Ct Chest W Contrast  Result Date: 10/10/2016 CLINICAL DATA:  Low-grade fever, leukocytosis and generalized weakness. EXAM: CT CHEST, ABDOMEN, AND PELVIS WITH CONTRAST TECHNIQUE: Multidetector CT imaging of the chest, abdomen and pelvis was performed following the standard protocol during bolus administration of intravenous contrast. CONTRAST:  ISOVUE-300 IOPAMIDOL (ISOVUE-300) INJECTION 61% COMPARISON:  Chest CT exams dating back through 09/22/2015. FINDINGS: CT CHEST FINDINGS Cardiovascular: Borderline cardiomegaly with coronary arteriosclerosis along the left main, lad, RCA and circumflex. No pericardial effusion. Aortic atherosclerosis without aneurysmal dilatation or dissection. No large central pulmonary embolus is noted. Mediastinum/Nodes: Substernal extension of the thyroid gland with stable appearing benign-appearing subcentimeter hypodensities consistent with tiny nodules. Coarse calcification is seen a right internal thyroid gland. No mediastinal or hilar adenopathy. Trachea and mainstem bronchi as well as esophagus are normal in appearance. Lungs/Pleura: Stable right upper and right middle lobe nodules are noted, the right upper lobe nodules measuring between 5 and 6 mm on series 4 image 70 and 71 with right middle lobe subpleural nodules measuring 5 and 11 mm in maximum dimension, series 4, image 91 and 94. Centrilobular emphysema is noted in the upper lobes with superimposed airspace disease in the right upper lobe suspicious for concomitant pneumonia. Dependent atelectasis with subsegmental atelectasis noted in both lower lobes and lingula. No effusion or pneumothorax. Musculoskeletal: No chest wall mass or suspicious bone lesions identified. CT ABDOMEN PELVIS FINDINGS Hepatobiliary: No focal liver abnormality is seen. No gallstones, gallbladder wall thickening, or biliary dilatation. Pancreas: Unremarkable. No pancreatic ductal  dilatation or surrounding inflammatory changes. Spleen: Normal in size without focal abnormality. Adrenals/Urinary Tract: Hypodense 26, 18 and 11 nodules of the left adrenal gland are identified with Hounsfield units consistent with benign adenomas. Stable mild nodular thickening of the right adrenal gland. Mild bilateral renal cortical thinning. 12 mm water attenuating exophytic cyst off the interpolar right kidney. No nephrolithiasis, hydronephrosis nor hydroureter. Urinary bladder is unremarkable. Stomach/Bowel: The stomach is contracted. There is normal small bowel rotation without small-bowel obstruction or inflammation.  The appendix is not visualized but no right lower quadrant or pericecal inflammation is seen. A moderate amount of stool noted within large bowel. Scattered colonic diverticulosis is noted without acute diverticulitis. Vascular/Lymphatic: Aortoiliac and branch vessel atherosclerosis. No aneurysm or dissection. Patent portal and splenic veins. Reproductive: Enlarged prostate measuring 5.1 x 6.4 x 5.3  cm. Other: Status post right inguinal hernia repair. Musculoskeletal: Degenerative disc disease at L5-S1. Right hip arthroplasty. No acute nor suspicious osseous lesions. IMPRESSION: 1. Stable right upper and right middle lobe subpleural pulmonary nodules largest is approximately 11 mm in the right middle lobe unchanged since 09/22/2015 CT. Given stability of the nodules, a future CT at 18-24 months (from initial scan performed 09/22/2015) is considered optional for low-risk patients, but is recommended for high-risk patients. This recommendation follows the consensus statement: Guidelines for Management of Incidental Pulmonary Nodules Detected on CT Images: From the Fleischner Society 2017; Radiology 2017; 284:228-243. 2. COPD with superimposed airspace disease in the right upper lobe suspicious for pneumonia. 3. Mild thyromegaly with tiny subcentimeter hypodense nodules without worrisome features,  stable in appearance. 4. Stable left-sided adrenal nodules with enhancement consistent with benign adenomas. 5. Coronary arteriosclerosis and aortic atherosclerosis. 6. Tiny water attenuating 12 mm cyst off the interpolar right kidney. 7. Colonic diverticulosis without acute diverticulitis. No bowel inflammation or obstruction. 8. Enlarged prostate. 9. Status post right inguinal hernia repair and right hip arthroplasty Electronically Signed   By: Tollie Eth M.D.   On: 10/10/2016 19:20   Ct Abdomen Pelvis W Contrast  Result Date: 10/10/2016 CLINICAL DATA:  Low-grade fever, leukocytosis and generalized weakness. EXAM: CT CHEST, ABDOMEN, AND PELVIS WITH CONTRAST TECHNIQUE: Multidetector CT imaging of the chest, abdomen and pelvis was performed following the standard protocol during bolus administration of intravenous contrast. CONTRAST:  ISOVUE-300 IOPAMIDOL (ISOVUE-300) INJECTION 61% COMPARISON:  Chest CT exams dating back through 09/22/2015. FINDINGS: CT CHEST FINDINGS Cardiovascular: Borderline cardiomegaly with coronary arteriosclerosis along the left main, lad, RCA and circumflex. No pericardial effusion. Aortic atherosclerosis without aneurysmal dilatation or dissection. No large central pulmonary embolus is noted. Mediastinum/Nodes: Substernal extension of the thyroid gland with stable appearing benign-appearing subcentimeter hypodensities consistent with tiny nodules. Coarse calcification is seen a right internal thyroid gland. No mediastinal or hilar adenopathy. Trachea and mainstem bronchi as well as esophagus are normal in appearance. Lungs/Pleura: Stable right upper and right middle lobe nodules are noted, the right upper lobe nodules measuring between 5 and 6 mm on series 4 image 70 and 71 with right middle lobe subpleural nodules measuring 5 and 11 mm in maximum dimension, series 4, image 91 and 94. Centrilobular emphysema is noted in the upper lobes with superimposed airspace disease in the  right upper lobe suspicious for concomitant pneumonia. Dependent atelectasis with subsegmental atelectasis noted in both lower lobes and lingula. No effusion or pneumothorax. Musculoskeletal: No chest wall mass or suspicious bone lesions identified. CT ABDOMEN PELVIS FINDINGS Hepatobiliary: No focal liver abnormality is seen. No gallstones, gallbladder wall thickening, or biliary dilatation. Pancreas: Unremarkable. No pancreatic ductal dilatation or surrounding inflammatory changes. Spleen: Normal in size without focal abnormality. Adrenals/Urinary Tract: Hypodense 26, 18 and 11 nodules of the left adrenal gland are identified with Hounsfield units consistent with benign adenomas. Stable mild nodular thickening of the right adrenal gland. Mild bilateral renal cortical thinning. 12 mm water attenuating exophytic cyst off the interpolar right kidney. No nephrolithiasis, hydronephrosis nor hydroureter. Urinary bladder is unremarkable. Stomach/Bowel: The stomach is contracted. There is normal small bowel  rotation without small-bowel obstruction or inflammation. The appendix is not visualized but no right lower quadrant or pericecal inflammation is seen. A moderate amount of stool noted within large bowel. Scattered colonic diverticulosis is noted without acute diverticulitis. Vascular/Lymphatic: Aortoiliac and branch vessel atherosclerosis. No aneurysm or dissection. Patent portal and splenic veins. Reproductive: Enlarged prostate measuring 5.1 x 6.4 x 5.3  cm. Other: Status post right inguinal hernia repair. Musculoskeletal: Degenerative disc disease at L5-S1. Right hip arthroplasty. No acute nor suspicious osseous lesions. IMPRESSION: 1. Stable right upper and right middle lobe subpleural pulmonary nodules largest is approximately 11 mm in the right middle lobe unchanged since 09/22/2015 CT. Given stability of the nodules, a future CT at 18-24 months (from initial scan performed 09/22/2015) is considered optional for  low-risk patients, but is recommended for high-risk patients. This recommendation follows the consensus statement: Guidelines for Management of Incidental Pulmonary Nodules Detected on CT Images: From the Fleischner Society 2017; Radiology 2017; 284:228-243. 2. COPD with superimposed airspace disease in the right upper lobe suspicious for pneumonia. 3. Mild thyromegaly with tiny subcentimeter hypodense nodules without worrisome features, stable in appearance. 4. Stable left-sided adrenal nodules with enhancement consistent with benign adenomas. 5. Coronary arteriosclerosis and aortic atherosclerosis. 6. Tiny water attenuating 12 mm cyst off the interpolar right kidney. 7. Colonic diverticulosis without acute diverticulitis. No bowel inflammation or obstruction. 8. Enlarged prostate. 9. Status post right inguinal hernia repair and right hip arthroplasty Electronically Signed   By: Tollie Eth M.D.   On: 10/10/2016 19:20    ____________________________________________   PROCEDURES Procedures  ____________________________________________   INITIAL IMPRESSION / ASSESSMENT AND PLAN / ED COURSE  Pertinent labs & imaging results that were available during my care of the patient were reviewed by me and considered in my medical decision making (see chart for details).  Patient sent to ED for reported generalized weakness. Patient has no complaints but history is limited by his dementia. We will ring workup with labs urinalysis chest x-ray. He does appear to be dehydrated clinically, likely due to decreased oral intake and continued torsemide use.  Clinical Course as of Oct 11 2011  Fri Oct 10, 2016  1906 Labs neg except wbc 18. Will get CT c/a/p.  [PS]  2000 CT reveals PNA. Will give IV ceftriaxone, azithro, plan to admit.   [PS]    Clinical Course User Index [PS] Sharman Cheek, MD     ____________________________________________   FINAL CLINICAL IMPRESSION(S) / ED DIAGNOSES  Final  diagnoses:  Community acquired pneumonia of left upper lobe of lung (HCC)  Leukocytosis, unspecified type      New Prescriptions   No medications on file     Portions of this note were generated with dragon dictation software. Dictation errors may occur despite best attempts at proofreading.    Sharman Cheek, MD 10/10/16 2014

## 2016-10-10 NOTE — ED Notes (Signed)
Pt assisted with bathroom by Kennedy Buckerhanh, EDT.

## 2016-10-10 NOTE — Progress Notes (Signed)
Pharmacy Antibiotic Note  Daniel Barr is a 83 y.o. male admitted on 10/10/2016 with pneumonia.  Pharmacy has been consulted for ceftriaxone dosing.  Plan: Will increase ceftriaxone from 1g to 2g IV daily given patient's size.  Height: 5\' 7"  (170.2 cm) Weight: 220 lb (99.8 kg) IBW/kg (Calculated) : 66.1  Temp (24hrs), Avg:99 F (37.2 C), Min:99 F (37.2 C), Max:99 F (37.2 C)   Recent Labs Lab 10/10/16 1638  WBC 18.0*  CREATININE 1.11    Estimated Creatinine Clearance: 57.8 mL/min (by C-G formula based on SCr of 1.11 mg/dL).    No Known Allergies  Thank you for allowing pharmacy to be a part of this patient's care.  Daniel Barr  Daniel Barr 10/10/2016 10:42 PM

## 2016-10-10 NOTE — Progress Notes (Signed)
Pharmacy Antibiotic Note  Daniel Barr is a 83 y.o. male admitted on 10/10/2016 with pneumonia.  Pharmacy has been consulted for CTX and azithromycin dosing.  Plan: CTX 1 g IV Q24 hr  Azithromycin 500 mg IV Q24 hr  Height: 5\' 7"  (170.2 cm) Weight: 220 lb (99.8 kg) IBW/kg (Calculated) : 66.1  Temp (24hrs), Avg:99 F (37.2 C), Min:99 F (37.2 C), Max:99 F (37.2 C)   Recent Labs Lab 10/10/16 1638  WBC 18.0*  CREATININE 1.11    Estimated Creatinine Clearance: 57.8 mL/min (by C-G formula based on SCr of 1.11 mg/dL).    No Known Allergies  Antimicrobials this admission: CTX 6/8 >> Azithromycin 6/8 >>   Microbiology results: 6/8 UCx: sent  Thank you for allowing pharmacy to be a part of this patient's care.  Delsa BernKelly m Noura Purpura, PharmD 10/10/2016 8:03 PM

## 2016-10-10 NOTE — ED Notes (Signed)
Patient transported to X-ray 

## 2016-10-10 NOTE — ED Triage Notes (Signed)
Pt to ED via EMS from [facility] c/o weakness today.  Per EMS facility states patient becoming more weak while ambulating.   Typically uses walker.  Pt supposed to wear 3L [place] at home but says it dries him out.  Patient denying pain.  Hx of dementia and hard of hearing.

## 2016-10-11 DIAGNOSIS — I509 Heart failure, unspecified: Secondary | ICD-10-CM | POA: Diagnosis present

## 2016-10-11 DIAGNOSIS — Z9981 Dependence on supplemental oxygen: Secondary | ICD-10-CM | POA: Diagnosis not present

## 2016-10-11 DIAGNOSIS — D72829 Elevated white blood cell count, unspecified: Secondary | ICD-10-CM | POA: Diagnosis present

## 2016-10-11 DIAGNOSIS — J440 Chronic obstructive pulmonary disease with acute lower respiratory infection: Secondary | ICD-10-CM | POA: Diagnosis present

## 2016-10-11 DIAGNOSIS — E876 Hypokalemia: Secondary | ICD-10-CM | POA: Diagnosis present

## 2016-10-11 DIAGNOSIS — Z87891 Personal history of nicotine dependence: Secondary | ICD-10-CM | POA: Diagnosis not present

## 2016-10-11 DIAGNOSIS — I110 Hypertensive heart disease with heart failure: Secondary | ICD-10-CM | POA: Diagnosis present

## 2016-10-11 DIAGNOSIS — N400 Enlarged prostate without lower urinary tract symptoms: Secondary | ICD-10-CM | POA: Diagnosis present

## 2016-10-11 DIAGNOSIS — I4891 Unspecified atrial fibrillation: Secondary | ICD-10-CM | POA: Diagnosis present

## 2016-10-11 DIAGNOSIS — J189 Pneumonia, unspecified organism: Secondary | ICD-10-CM | POA: Diagnosis present

## 2016-10-11 DIAGNOSIS — E785 Hyperlipidemia, unspecified: Secondary | ICD-10-CM | POA: Diagnosis present

## 2016-10-11 DIAGNOSIS — Z7982 Long term (current) use of aspirin: Secondary | ICD-10-CM | POA: Diagnosis not present

## 2016-10-11 DIAGNOSIS — K219 Gastro-esophageal reflux disease without esophagitis: Secondary | ICD-10-CM | POA: Diagnosis present

## 2016-10-11 DIAGNOSIS — Z79899 Other long term (current) drug therapy: Secondary | ICD-10-CM | POA: Diagnosis not present

## 2016-10-11 DIAGNOSIS — Z66 Do not resuscitate: Secondary | ICD-10-CM | POA: Diagnosis present

## 2016-10-11 LAB — BASIC METABOLIC PANEL
ANION GAP: 7 (ref 5–15)
BUN: 10 mg/dL (ref 6–20)
CALCIUM: 8.2 mg/dL — AB (ref 8.9–10.3)
CO2: 27 mmol/L (ref 22–32)
Chloride: 109 mmol/L (ref 101–111)
Creatinine, Ser: 0.68 mg/dL (ref 0.61–1.24)
Glucose, Bld: 106 mg/dL — ABNORMAL HIGH (ref 65–99)
POTASSIUM: 3.1 mmol/L — AB (ref 3.5–5.1)
Sodium: 143 mmol/L (ref 135–145)

## 2016-10-11 LAB — CBC
HEMATOCRIT: 42.6 % (ref 40.0–52.0)
HEMOGLOBIN: 14.6 g/dL (ref 13.0–18.0)
MCH: 30.4 pg (ref 26.0–34.0)
MCHC: 34.3 g/dL (ref 32.0–36.0)
MCV: 88.5 fL (ref 80.0–100.0)
Platelets: 274 10*3/uL (ref 150–440)
RBC: 4.82 MIL/uL (ref 4.40–5.90)
RDW: 14.7 % — ABNORMAL HIGH (ref 11.5–14.5)
WBC: 12.3 10*3/uL — ABNORMAL HIGH (ref 3.8–10.6)

## 2016-10-11 LAB — MRSA PCR SCREENING: MRSA by PCR: NEGATIVE

## 2016-10-11 LAB — STREP PNEUMONIAE URINARY ANTIGEN: Strep Pneumo Urinary Antigen: NEGATIVE

## 2016-10-11 NOTE — Evaluation (Signed)
Physical Therapy Evaluation Patient Details Name: Daniel Barr MRN: [id number] DOB: 06/13/1931 Today's Date: 10/11/2016   History of Present Illness  83 yo male with onset of PNA, from ALF [facility], used RW with ind at baseline.  PMHx:  R THA, lung and thyroid nodules, COPD, a-fib.  Clinical Impression  Pt is getting up to stand with PT but cannot take a step over his cognitive issues.  Pt returned to bed with substantial encouragement but asked to just stay there standing alone.  He is appropriate for SNF to increase his independent gait, to promote his safety with transfers and to increase fluid initiation of any movement includign getting to side of bed.  He will continue acutely to work on these skills to decrease his time in SNF and to see if he can progress enough to go right back to ALF if possible.      Follow Up Recommendations SNF    Equipment Recommendations  None recommended by PT    Recommendations for Other Services       Precautions / Restrictions Precautions Precautions: Fall (telemtry) Restrictions Weight Bearing Restrictions: No      Mobility  Bed Mobility Overal bed mobility: Needs Assistance Bed Mobility: Supine to Sit;Sit to Supine     Supine to sit: Mod assist Sit to supine: Min assist   General bed mobility comments: assisted legs to return to bed but pt very able to help  Transfers Overall transfer level: Needs assistance Equipment used: 1 person hand held assist Transfers: Sit to/from Stand Sit to Stand: Min assist         General transfer comment: pt is confused to plan the sequence and needs min assist mainly to start the task  Ambulation/Gait             General Gait Details: deferred due to pt unwillingness  to try a step  Stairs            Wheelchair Mobility    Modified Rankin (Stroke Patients Only) Modified Rankin (Stroke Patients Only) Pre-Morbid Rankin Score: Slight disability Modified Rankin: Moderately  severe disability     Balance Overall balance assessment: Needs assistance Sitting-balance support: Feet supported Sitting balance-Leahy Scale: Fair     Standing balance support: Bilateral upper extremity supported Standing balance-Leahy Scale: Fair Standing balance comment: needs supervision with standing due to confusion                             Pertinent Vitals/Pain Pain Assessment: No/denies pain    Home Living Family/patient expects to be discharged to:: Assisted living               Home Equipment: Walker - 2 wheels      Prior Function Level of Independence: Needs assistance   Gait / Transfers Assistance Needed: RW with ind at ALF  ADL's / Homemaking Assistance Needed: lives in ALF for personal care and meals  Comments: pt is not able to confirm his PLOF but per chart was ind as reported by ALF with RW     Hand Dominance        Extremity/Trunk Assessment        Lower Extremity Assessment Lower Extremity Assessment: Generalized weakness    Cervical / Trunk Assessment Cervical / Trunk Assessment: Normal  Communication   Communication: HOH  Cognition Arousal/Alertness: Lethargic Behavior During Therapy: Impulsive Overall Cognitive Status: History of cognitive impairments - at baseline  General Comments: no information about how this confusion compares to PLOF with dementia      General Comments      Exercises     Assessment/Plan    PT Assessment Patient needs continued PT services  PT Problem List Decreased strength;Decreased range of motion;Decreased activity tolerance;Decreased balance;Decreased mobility;Decreased coordination;Decreased cognition;Decreased knowledge of use of DME;Decreased safety awareness;Obesity       PT Treatment Interventions DME instruction;Gait training;Functional mobility training;Therapeutic activities;Therapeutic exercise;Balance training;Neuromuscular  re-education;Patient/family education    PT Goals (Current goals can be found in the Care Plan section)  Acute Rehab PT Goals Patient Stated Goal: none stated PT Goal Formulation: Patient unable to participate in goal setting Time For Goal Achievement: 10/25/16 Potential to Achieve Goals: Fair    Frequency Min 2X/week   Barriers to discharge Other (comment) (in ALF where he may not have supervised gait ) will need to get stronger to qualify for ALF    Co-evaluation               AM-PAC PT "6 Clicks" Daily Activity  Outcome Measure Difficulty turning over in bed (including adjusting bedclothes, sheets and blankets)?: Total Difficulty moving from lying on back to sitting on the side of the bed? : Total Difficulty sitting down on and standing up from a chair with arms (e.g., wheelchair, bedside commode, etc,.)?: Total Help needed moving to and from a bed to chair (including a wheelchair)?: A Lot Help needed walking in hospital room?: A Lot Help needed climbing 3-5 steps with a railing? : Total 6 Click Score: 8    End of Session Equipment Utilized During Treatment: Gait belt Activity Tolerance: Patient tolerated treatment well;Other (comment) (confused) Patient left: in bed;with call bell/phone within reach;with bed alarm set Nurse Communication: Mobility status PT Visit Diagnosis: Muscle weakness (generalized) (M62.81);Difficulty in walking, not elsewhere classified (R26.2)    Time: [id number] PT Time Calculation (min) (ACUTE ONLY): 23 min   Charges:   PT Evaluation $PT Eval Moderate Complexity: 1 Procedure PT Treatments $Therapeutic Activity: 8-22 mins   PT G Codes:   PT G-Codes **NOT FOR INPATIENT CLASS** Functional Assessment Tool Used: AM-PAC 6 Clicks Basic Mobility;Clinical judgement Functional Limitation: Mobility: Walking and moving around Mobility: Walking and Moving Around Current Status ([id number](G8978): At least 40 percent but less than 60 percent impaired, limited  or restricted Mobility: Walking and Moving Around Goal Status [phone](G8979): At least 1 percent but less than 20 percent impaired, limited or restricted    Ivar DrapeRuth E Ell Tiso 10/11/2016, 10:36 AM   Samul Dadauth Roxanna Mcever, PT MS Acute Rehab Dept. Number: [facility] [phone] and [facility] [phone]

## 2016-10-11 NOTE — Progress Notes (Signed)
This Clinical research associatewriter received call from Dr. Renae GlossWieting questioning documentation of 5L o2 from 7:28am. Documentation was made in error.  Patient is on 3L of oxygen. Corrected by nurse.

## 2016-10-11 NOTE — Progress Notes (Signed)
Patient ID: Daniel Barr, male   DOB: Sep 06, 1931, 83 y.o.   MRN: [id number]  [employer] PROGRESS NOTE  Daniel Barr:[id number] DOB: 06/06/1931 DOA: 10/10/2016 PCP: Derwood Kaplan, MD  HPI/Subjective: Patient feeling okay. Oxygen requirements back to his usual. Some cough. No problems swallowing.  Objective: Vitals:   10/10/16 2200 10/11/16 0728  BP: (!) 148/68 (!) 166/73  Pulse: 60 62  Resp:  19  Temp: 99.4 F (37.4 C) 98.3 F (36.8 C)    Filed Weights   10/10/16 1620  Weight: 99.8 kg (220 lb)    ROS: Review of Systems  Constitutional: Negative for chills and fever.  Eyes: Negative for blurred vision.  Respiratory: Positive for cough. Negative for shortness of breath.   Cardiovascular: Negative for chest pain.  Gastrointestinal: Negative for abdominal pain, constipation, diarrhea, nausea and vomiting.  Genitourinary: Negative for dysuria.  Musculoskeletal: Negative for joint pain.  Neurological: Negative for dizziness and headaches.   Exam: Physical Exam  HENT:  Nose: No mucosal edema.  Mouth/Throat: No oropharyngeal exudate or posterior oropharyngeal edema.  Eyes: Conjunctivae, EOM and lids are normal. Pupils are equal, round, and reactive to light.  Neck: No JVD present. Carotid bruit is not present. No edema present. No thyroid mass and no thyromegaly present.  Cardiovascular: S1 normal, S2 normal and normal heart sounds.  Exam reveals no gallop.   No murmur heard. Pulses:      Dorsalis pedis pulses are 2+ on the right side, and 2+ on the left side.  Respiratory: No respiratory distress. He has decreased breath sounds in the right lower field and the left lower field. He has no wheezes. He has no rhonchi. He has no rales.  GI: Soft. Bowel sounds are normal. There is no tenderness.  Musculoskeletal:       Right ankle: He exhibits no swelling.       Left ankle: He exhibits no swelling.  Lymphadenopathy:    He has no cervical adenopathy.  Neurological: He  is alert. No cranial nerve deficit.  Skin: Skin is warm. No rash noted. Nails show no clubbing.  Psychiatric: He has a normal mood and affect.      Data Reviewed: Basic Metabolic Panel:  Recent Labs Lab 10/10/16 1638 10/11/16 0314  NA 144 143  K 3.6 3.1*  CL 107 109  CO2 27 27  GLUCOSE 89 106*  BUN 13 10  CREATININE 1.11 0.68  CALCIUM 8.6* 8.2*   CBC:  Recent Labs Lab 10/10/16 1638 10/11/16 0314  WBC 18.0* 12.3*  HGB 14.3 14.6  HCT 42.8 42.6  MCV 87.2 88.5  PLT 310 274    CBG:  Recent Labs Lab 10/10/16 2306  GLUCAP 137*    Recent Results (from the past 240 hour(s))  MRSA PCR Screening     Status: None   Collection Time: 10/11/16  6:32 AM  Result Value Ref Range Status   MRSA by PCR NEGATIVE NEGATIVE Final    Comment:        The GeneXpert MRSA Assay (FDA approved for NASAL specimens only), is one component of a comprehensive MRSA colonization surveillance program. It is not intended to diagnose MRSA infection nor to guide or monitor treatment for MRSA infections.      Studies: Dg Chest 2 View  Result Date: 10/10/2016 CLINICAL DATA:  Shortness of breath and weakness EXAM: CHEST  2 VIEW COMPARISON:  Chest CT and chest radiograph 11/21/2015 FINDINGS: Shallow lung inflation. Unchanged cardiomegaly with calcification  in the aortic arch. There is mild interstitial edema. No focal consolidation. No pleural effusion or pneumothorax. IMPRESSION: Mild interstitial pulmonary edema with unchanged cardiomegaly and aortic atherosclerosis. Electronically Signed   By: Deatra Robinson M.D.   On: 10/10/2016 16:57   Ct Chest W Contrast  Result Date: 10/10/2016 CLINICAL DATA:  Low-grade fever, leukocytosis and generalized weakness. EXAM: CT CHEST, ABDOMEN, AND PELVIS WITH CONTRAST TECHNIQUE: Multidetector CT imaging of the chest, abdomen and pelvis was performed following the standard protocol during bolus administration of intravenous contrast. CONTRAST:  ISOVUE-300  IOPAMIDOL (ISOVUE-300) INJECTION 61% COMPARISON:  Chest CT exams dating back through 09/22/2015. FINDINGS: CT CHEST FINDINGS Cardiovascular: Borderline cardiomegaly with coronary arteriosclerosis along the left main, lad, RCA and circumflex. No pericardial effusion. Aortic atherosclerosis without aneurysmal dilatation or dissection. No large central pulmonary embolus is noted. Mediastinum/Nodes: Substernal extension of the thyroid gland with stable appearing benign-appearing subcentimeter hypodensities consistent with tiny nodules. Coarse calcification is seen a right internal thyroid gland. No mediastinal or hilar adenopathy. Trachea and mainstem bronchi as well as esophagus are normal in appearance. Lungs/Pleura: Stable right upper and right middle lobe nodules are noted, the right upper lobe nodules measuring between 5 and 6 mm on series 4 image 70 and 71 with right middle lobe subpleural nodules measuring 5 and 11 mm in maximum dimension, series 4, image 91 and 94. Centrilobular emphysema is noted in the upper lobes with superimposed airspace disease in the right upper lobe suspicious for concomitant pneumonia. Dependent atelectasis with subsegmental atelectasis noted in both lower lobes and lingula. No effusion or pneumothorax. Musculoskeletal: No chest wall mass or suspicious bone lesions identified. CT ABDOMEN PELVIS FINDINGS Hepatobiliary: No focal liver abnormality is seen. No gallstones, gallbladder wall thickening, or biliary dilatation. Pancreas: Unremarkable. No pancreatic ductal dilatation or surrounding inflammatory changes. Spleen: Normal in size without focal abnormality. Adrenals/Urinary Tract: Hypodense 26, 18 and 11 nodules of the left adrenal gland are identified with Hounsfield units consistent with benign adenomas. Stable mild nodular thickening of the right adrenal gland. Mild bilateral renal cortical thinning. 12 mm water attenuating exophytic cyst off the interpolar right kidney. No  nephrolithiasis, hydronephrosis nor hydroureter. Urinary bladder is unremarkable. Stomach/Bowel: The stomach is contracted. There is normal small bowel rotation without small-bowel obstruction or inflammation. The appendix is not visualized but no right lower quadrant or pericecal inflammation is seen. A moderate amount of stool noted within large bowel. Scattered colonic diverticulosis is noted without acute diverticulitis. Vascular/Lymphatic: Aortoiliac and branch vessel atherosclerosis. No aneurysm or dissection. Patent portal and splenic veins. Reproductive: Enlarged prostate measuring 5.1 x 6.4 x 5.3  cm. Other: Status post right inguinal hernia repair. Musculoskeletal: Degenerative disc disease at L5-S1. Right hip arthroplasty. No acute nor suspicious osseous lesions. IMPRESSION: 1. Stable right upper and right middle lobe subpleural pulmonary nodules largest is approximately 11 mm in the right middle lobe unchanged since 09/22/2015 CT. Given stability of the nodules, a future CT at 18-24 months (from initial scan performed 09/22/2015) is considered optional for low-risk patients, but is recommended for high-risk patients. This recommendation follows the consensus statement: Guidelines for Management of Incidental Pulmonary Nodules Detected on CT Images: From the Fleischner Society 2017; Radiology 2017; 284:228-243. 2. COPD with superimposed airspace disease in the right upper lobe suspicious for pneumonia. 3. Mild thyromegaly with tiny subcentimeter hypodense nodules without worrisome features, stable in appearance. 4. Stable left-sided adrenal nodules with enhancement consistent with benign adenomas. 5. Coronary arteriosclerosis and aortic atherosclerosis. 6. Tiny water attenuating  12 mm cyst off the interpolar right kidney. 7. Colonic diverticulosis without acute diverticulitis. No bowel inflammation or obstruction. 8. Enlarged prostate. 9. Status post right inguinal hernia repair and right hip arthroplasty  Electronically Signed   By: Tollie Eth M.D.   On: 10/10/2016 19:20   Ct Abdomen Pelvis W Contrast  Result Date: 10/10/2016 CLINICAL DATA:  Low-grade fever, leukocytosis and generalized weakness. EXAM: CT CHEST, ABDOMEN, AND PELVIS WITH CONTRAST TECHNIQUE: Multidetector CT imaging of the chest, abdomen and pelvis was performed following the standard protocol during bolus administration of intravenous contrast. CONTRAST:  ISOVUE-300 IOPAMIDOL (ISOVUE-300) INJECTION 61% COMPARISON:  Chest CT exams dating back through 09/22/2015. FINDINGS: CT CHEST FINDINGS Cardiovascular: Borderline cardiomegaly with coronary arteriosclerosis along the left main, lad, RCA and circumflex. No pericardial effusion. Aortic atherosclerosis without aneurysmal dilatation or dissection. No large central pulmonary embolus is noted. Mediastinum/Nodes: Substernal extension of the thyroid gland with stable appearing benign-appearing subcentimeter hypodensities consistent with tiny nodules. Coarse calcification is seen a right internal thyroid gland. No mediastinal or hilar adenopathy. Trachea and mainstem bronchi as well as esophagus are normal in appearance. Lungs/Pleura: Stable right upper and right middle lobe nodules are noted, the right upper lobe nodules measuring between 5 and 6 mm on series 4 image 70 and 71 with right middle lobe subpleural nodules measuring 5 and 11 mm in maximum dimension, series 4, image 91 and 94. Centrilobular emphysema is noted in the upper lobes with superimposed airspace disease in the right upper lobe suspicious for concomitant pneumonia. Dependent atelectasis with subsegmental atelectasis noted in both lower lobes and lingula. No effusion or pneumothorax. Musculoskeletal: No chest wall mass or suspicious bone lesions identified. CT ABDOMEN PELVIS FINDINGS Hepatobiliary: No focal liver abnormality is seen. No gallstones, gallbladder wall thickening, or biliary dilatation. Pancreas: Unremarkable. No  pancreatic ductal dilatation or surrounding inflammatory changes. Spleen: Normal in size without focal abnormality. Adrenals/Urinary Tract: Hypodense 26, 18 and 11 nodules of the left adrenal gland are identified with Hounsfield units consistent with benign adenomas. Stable mild nodular thickening of the right adrenal gland. Mild bilateral renal cortical thinning. 12 mm water attenuating exophytic cyst off the interpolar right kidney. No nephrolithiasis, hydronephrosis nor hydroureter. Urinary bladder is unremarkable. Stomach/Bowel: The stomach is contracted. There is normal small bowel rotation without small-bowel obstruction or inflammation. The appendix is not visualized but no right lower quadrant or pericecal inflammation is seen. A moderate amount of stool noted within large bowel. Scattered colonic diverticulosis is noted without acute diverticulitis. Vascular/Lymphatic: Aortoiliac and branch vessel atherosclerosis. No aneurysm or dissection. Patent portal and splenic veins. Reproductive: Enlarged prostate measuring 5.1 x 6.4 x 5.3  cm. Other: Status post right inguinal hernia repair. Musculoskeletal: Degenerative disc disease at L5-S1. Right hip arthroplasty. No acute nor suspicious osseous lesions. IMPRESSION: 1. Stable right upper and right middle lobe subpleural pulmonary nodules largest is approximately 11 mm in the right middle lobe unchanged since 09/22/2015 CT. Given stability of the nodules, a future CT at 18-24 months (from initial scan performed 09/22/2015) is considered optional for low-risk patients, but is recommended for high-risk patients. This recommendation follows the consensus statement: Guidelines for Management of Incidental Pulmonary Nodules Detected on CT Images: From the Fleischner Society 2017; Radiology 2017; 284:228-243. 2. COPD with superimposed airspace disease in the right upper lobe suspicious for pneumonia. 3. Mild thyromegaly with tiny subcentimeter hypodense nodules without  worrisome features, stable in appearance. 4. Stable left-sided adrenal nodules with enhancement consistent with benign adenomas. 5. Coronary arteriosclerosis  and aortic atherosclerosis. 6. Tiny water attenuating 12 mm cyst off the interpolar right kidney. 7. Colonic diverticulosis without acute diverticulitis. No bowel inflammation or obstruction. 8. Enlarged prostate. 9. Status post right inguinal hernia repair and right hip arthroplasty Electronically Signed   By: Tollie Ethavid  Kwon M.D.   On: 10/10/2016 19:20    Scheduled Meds: . amLODipine  10 mg Oral Daily  . aspirin EC  81 mg Oral Daily  . enalapril  20 mg Oral BID  . enoxaparin (LOVENOX) injection  40 mg Subcutaneous Q24H  . loratadine  10 mg Oral Daily  . Melatonin  2.5 mg Oral QHS  . pantoprazole  40 mg Oral Daily  . potassium chloride SA  40 mEq Oral Daily  . pregabalin  75 mg Oral BID  . sotalol  80 mg Oral BID  . tamsulosin  0.8 mg Oral Daily  . torsemide  20 mg Oral Daily   Continuous Infusions: . sodium chloride 40 mL/hr at 10/11/16 0816  . azithromycin    . azithromycin    . cefTRIAXone (ROCEPHIN) IVPB 2 gram/50 mL D5W (Pyxis)      Assessment/Plan:  1. Right upper lobe pneumonia which is community-acquired. Placed on Rocephin and Zithromax. No problems swallowing as per patient. Nebulizer treatments. 2. Pulmonary nodules stable from last appearance 3. Hypokalemia replace potassium orally 4. Chronic respiratory failure and COPD on oxygen. Patient chronically on 3 L of oxygen 5. Hypertension on Norvasc and enalapril 6. History of congestive heart failure. Unknown type. No signs of heart failure. Discontinue IV fluids at this point. 7. History of atrial fibrillation on sotalol. Aspirin only for anticoagulation. 8. Weakness. Physical therapy recommended rehabilitation  Code Status:     Code Status Orders        Start     Ordered   10/10/16 2318  Do not attempt resuscitation (DNR)  Continuous    Question Answer Comment   In the event of cardiac or respiratory ARREST Do not call a "code blue"   In the event of cardiac or respiratory ARREST Do not perform Intubation, CPR, defibrillation or ACLS   In the event of cardiac or respiratory ARREST Use medication by any route, position, wound care, and other measures to relive pain and suffering. May use oxygen, suction and manual treatment of airway obstruction as needed for comfort.      10/10/16 2317    Code Status History    Date Active Date Inactive Code Status Order ID Comments User Context   10/10/2016 10:11 PM 10/10/2016 11:17 PM Full Code [id number]  Tonye RoyaltyHugelmeyer, Alexis, DO Inpatient   11/22/2015  3:05 AM 11/22/2015  8:03 PM DNR [id number]  Tonye RoyaltyHugelmeyer, Alexis, DO Inpatient   09/22/2015 10:22 PM 09/25/2015  4:46 PM DNR [id number]  Oralia ManisWillis, David, MD Inpatient    Advance Directive Documentation     Most Recent Value  Type of Advance Directive  Out of facility DNR (pink MOST or yellow form)  Pre-existing out of facility DNR order (yellow form or pink MOST form)  -  "MOST" Form in Place?  -     Family Communication: Spoke with son on the phone Disposition Plan: Physical therapy is recommended rehabilitation  Antibiotics:  Rocephin  Zithromax  Time spent: 28 minutes  Alford HighlandWIETING, Damiean Lukes  [employer]

## 2016-10-11 NOTE — Clinical Social Work Note (Signed)
Clinical Social Work Assessment  Patient Details  Name: Daniel Barr MRN: [id number] Date of Birth: 05/20/1931  Date of referral:  10/11/16               Reason for consult:  Facility Placement                Permission sought to share information with:  Facility Industrial/product designerContact Representative Permission granted to share information::  Yes, Verbal Permission Granted  Name::        Agency::     Relationship::     Contact Information:     Housing/Transportation Living arrangements for the past 2 months:  Assisted Living Facility Source of Information:  Adult Children Patient Interpreter Needed:  None Criminal Activity/Legal Involvement Pertinent to Current Situation/Hospitalization:  No - Comment as needed Significant Relationships:  Adult Children Lives with:  Facility Resident Do you feel safe going back to the place where you live?  Yes Need for family participation in patient care:  Yes (Comment)  Care giving concerns:  Patient admitted from ALF [facility]); PT recommendation for SNF   Social Worker assessment / plan:  CSW attempted to visit the patient at bedside to discuss discharge planning. Due to the patient's dementia, he was unable to participate. The CSW attempted to contact his son and left a voicemail. The CSW was able to contact the patient's daughter Daniel Barr. The CSW explained that PT is recommending SNF for STR. The patient's daughter indicated that the family would prefer that the patient return to [facility] with home health due to his issues with dementia.  The CSW contacted Laverne with [facility] who reported that she would have to assess the patient prior to his return to [facility]. The CSW explained to Laverne that the patient's family does not want SNF placement. Laverne indicated that she would assess the patient in the morning of 6/10. The patient may discharge 6/10 pending the evaluation.  Employment status:  Retired Product/process development scientistnsurance information:  Managed  Medicare PT Recommendations:  Skilled Nursing Facility Information / Referral to community resources:   (Ver[facility])  Patient/Family's Response to care:  The patient's family thanked the CSW for giving full information.  Patient/Family's Understanding of and Emotional Response to Diagnosis, Current Treatment, and Prognosis:  The patient has dementia. The family understands the complexity of removing him from familiar surroundings.  Emotional Assessment Appearance:  Appears stated age Attitude/Demeanor/Rapport:  Lethargic Affect (typically observed):  Pleasant Orientation:  Oriented to Self Alcohol / Substance use:  Never Used Psych involvement (Current and /or in the community):  No (Comment)  Discharge Needs  Concerns to be addressed:  Care Coordination, Discharge Planning Concerns Readmission within the last 30 days:  No Current discharge risk:  Cognitively Impaired Barriers to Discharge:  Continued Medical Work up   UAL CorporationKaren M Tais Koestner, LCSW 10/11/2016, 2:45 PM

## 2016-10-12 LAB — URINE CULTURE: CULTURE: NO GROWTH

## 2016-10-12 LAB — THYROID PANEL WITH TSH
FREE THYROXINE INDEX: 1.6 (ref 1.2–4.9)
T3 Uptake Ratio: 27 % (ref 24–39)
T4, Total: 5.9 ug/dL (ref 4.5–12.0)
TSH: 0.711 u[IU]/mL (ref 0.450–4.500)

## 2016-10-12 MED ORDER — AZITHROMYCIN 500 MG PO TABS: 500.0000 mg | ORAL_TABLET | ORAL | Status: DC

## 2016-10-12 MED ORDER — DOXYCYCLINE HYCLATE 100 MG PO TABS
100.0000 mg | ORAL_TABLET | Freq: Two times a day (BID) | ORAL | Status: DC
  Administered 2016-10-12 – 2016-10-13 (×2): 100 mg via ORAL
  Filled 2016-10-12 (×2): qty 1

## 2016-10-12 NOTE — Progress Notes (Signed)
Patient ID: Daniel Barr, male   DOB: 1931/08/05, 83 y.o.   MRN: [id number]  [employer] PROGRESS NOTE  Daniel Barr:[id number] DOB: 12-17-1931 DOA: 10/10/2016 PCP: Derwood Kaplan, MD  HPI/Subjective: Patient difficult to communicate with because his hearing aids were lost.  Objective: Vitals:   10/11/16 2348 10/12/16 1025  BP: (!) 113/55 (!) 165/90  Pulse: 71 68  Resp: 20 18  Temp: 98 F (36.7 C) 98.5 F (36.9 C)    Filed Weights   10/10/16 1620  Weight: 99.8 kg (220 lb)    ROS: Review of Systems  Unable to perform ROS: Language  Patient's hearing aids were lost overnight and he is unable to hear very well to communicate. Exam: Physical Exam  HENT:  Nose: No mucosal edema.  Mouth/Throat: No oropharyngeal exudate or posterior oropharyngeal edema.  Eyes: Conjunctivae, EOM and lids are normal. Pupils are equal, round, and reactive to light.  Neck: No JVD present. Carotid bruit is not present. No edema present. No thyroid mass and no thyromegaly present.  Cardiovascular: S1 normal, S2 normal and normal heart sounds.  Exam reveals no gallop.   No murmur heard. Pulses:      Dorsalis pedis pulses are 2+ on the right side, and 2+ on the left side.  Respiratory: No respiratory distress. He has decreased breath sounds in the right lower field and the left lower field. He has no wheezes. He has no rhonchi. He has no rales.  GI: Soft. Bowel sounds are normal. There is no tenderness.  Musculoskeletal:       Right ankle: He exhibits no swelling.       Left ankle: He exhibits no swelling.  Lymphadenopathy:    He has no cervical adenopathy.  Neurological: He is alert. No cranial nerve deficit.  Skin: Skin is warm. No rash noted. Nails show no clubbing.  Psychiatric: He has a normal mood and affect.      Data Reviewed: Basic Metabolic Panel:  Recent Labs Lab 10/10/16 1638 10/11/16 0314  NA 144 143  K 3.6 3.1*  CL 107 109  CO2 27 27  GLUCOSE 89 106*  BUN 13 10   CREATININE 1.11 0.68  CALCIUM 8.6* 8.2*   CBC:  Recent Labs Lab 10/10/16 1638 10/11/16 0314  WBC 18.0* 12.3*  HGB 14.3 14.6  HCT 42.8 42.6  MCV 87.2 88.5  PLT 310 274    CBG:  Recent Labs Lab 10/10/16 2306  GLUCAP 137*    Recent Results (from the past 240 hour(s))  Urine culture     Status: None   Collection Time: 10/10/16  4:38 PM  Result Value Ref Range Status   Specimen Description URINE, RANDOM  Final   Special Requests NONE  Final   Culture   Final    NO GROWTH Performed at [facility], [address], [place], [place] [address]    Report Status 10/12/2016 FINAL  Final  MRSA PCR Screening     Status: None   Collection Time: 10/11/16  6:32 AM  Result Value Ref Range Status   MRSA by PCR NEGATIVE NEGATIVE Final    Comment:        The GeneXpert MRSA Assay (FDA approved for NASAL specimens only), is one component of a comprehensive MRSA colonization surveillance program. It is not intended to diagnose MRSA infection nor to guide or monitor treatment for MRSA infections.      Studies: Dg Chest 2 View  Result Date:  10/10/2016 CLINICAL DATA:  Shortness of breath and weakness EXAM: CHEST  2 VIEW COMPARISON:  Chest CT and chest radiograph 11/21/2015 FINDINGS: Shallow lung inflation. Unchanged cardiomegaly with calcification in the aortic arch. There is mild interstitial edema. No focal consolidation. No pleural effusion or pneumothorax. IMPRESSION: Mild interstitial pulmonary edema with unchanged cardiomegaly and aortic atherosclerosis. Electronically Signed   By: Deatra RobinsonKevin  Herman M.D.   On: 10/10/2016 16:57   Ct Chest W Contrast  Result Date: 10/10/2016 CLINICAL DATA:  Low-grade fever, leukocytosis and generalized weakness. EXAM: CT CHEST, ABDOMEN, AND PELVIS WITH CONTRAST TECHNIQUE: Multidetector CT imaging of the chest, abdomen and pelvis was performed following the standard protocol during bolus administration of intravenous contrast. CONTRAST:  100mL  ISOVUE-300 IOPAMIDOL (ISOVUE-300) INJECTION 61% COMPARISON:  Chest CT exams dating back through 09/22/2015. FINDINGS: CT CHEST FINDINGS Cardiovascular: Borderline cardiomegaly with coronary arteriosclerosis along the left main, lad, RCA and circumflex. No pericardial effusion. Aortic atherosclerosis without aneurysmal dilatation or dissection. No large central pulmonary embolus is noted. Mediastinum/Nodes: Substernal extension of the thyroid gland with stable appearing benign-appearing subcentimeter hypodensities consistent with tiny nodules. Coarse calcification is seen a right internal thyroid gland. No mediastinal or hilar adenopathy. Trachea and mainstem bronchi as well as esophagus are normal in appearance. Lungs/Pleura: Stable right upper and right middle lobe nodules are noted, the right upper lobe nodules measuring between 5 and 6 mm on series 4 image 70 and 71 with right middle lobe subpleural nodules measuring 5 and 11 mm in maximum dimension, series 4, image 91 and 94. Centrilobular emphysema is noted in the upper lobes with superimposed airspace disease in the right upper lobe suspicious for concomitant pneumonia. Dependent atelectasis with subsegmental atelectasis noted in both lower lobes and lingula. No effusion or pneumothorax. Musculoskeletal: No chest wall mass or suspicious bone lesions identified. CT ABDOMEN PELVIS FINDINGS Hepatobiliary: No focal liver abnormality is seen. No gallstones, gallbladder wall thickening, or biliary dilatation. Pancreas: Unremarkable. No pancreatic ductal dilatation or surrounding inflammatory changes. Spleen: Normal in size without focal abnormality. Adrenals/Urinary Tract: Hypodense 26, 18 and 11 nodules of the left adrenal gland are identified with Hounsfield units consistent with benign adenomas. Stable mild nodular thickening of the right adrenal gland. Mild bilateral renal cortical thinning. 12 mm water attenuating exophytic cyst off the interpolar right kidney.  No nephrolithiasis, hydronephrosis nor hydroureter. Urinary bladder is unremarkable. Stomach/Bowel: The stomach is contracted. There is normal small bowel rotation without small-bowel obstruction or inflammation. The appendix is not visualized but no right lower quadrant or pericecal inflammation is seen. A moderate amount of stool noted within large bowel. Scattered colonic diverticulosis is noted without acute diverticulitis. Vascular/Lymphatic: Aortoiliac and branch vessel atherosclerosis. No aneurysm or dissection. Patent portal and splenic veins. Reproductive: Enlarged prostate measuring 5.1 x 6.4 x 5.3  cm. Other: Status post right inguinal hernia repair. Musculoskeletal: Degenerative disc disease at L5-S1. Right hip arthroplasty. No acute nor suspicious osseous lesions. IMPRESSION: 1. Stable right upper and right middle lobe subpleural pulmonary nodules largest is approximately 11 mm in the right middle lobe unchanged since 09/22/2015 CT. Given stability of the nodules, a future CT at 18-24 months (from initial scan performed 09/22/2015) is considered optional for low-risk patients, but is recommended for high-risk patients. This recommendation follows the consensus statement: Guidelines for Management of Incidental Pulmonary Nodules Detected on CT Images: From the Fleischner Society 2017; Radiology 2017; 284:228-243. 2. COPD with superimposed airspace disease in the right upper lobe suspicious for pneumonia. 3. Mild thyromegaly with tiny  subcentimeter hypodense nodules without worrisome features, stable in appearance. 4. Stable left-sided adrenal nodules with enhancement consistent with benign adenomas. 5. Coronary arteriosclerosis and aortic atherosclerosis. 6. Tiny water attenuating 12 mm cyst off the interpolar right kidney. 7. Colonic diverticulosis without acute diverticulitis. No bowel inflammation or obstruction. 8. Enlarged prostate. 9. Status post right inguinal hernia repair and right hip  arthroplasty Electronically Signed   By: Tollie Eth M.D.   On: 10/10/2016 19:20   Ct Abdomen Pelvis W Contrast  Result Date: 10/10/2016 CLINICAL DATA:  Low-grade fever, leukocytosis and generalized weakness. EXAM: CT CHEST, ABDOMEN, AND PELVIS WITH CONTRAST TECHNIQUE: Multidetector CT imaging of the chest, abdomen and pelvis was performed following the standard protocol during bolus administration of intravenous contrast. CONTRAST:  ISOVUE-300 IOPAMIDOL (ISOVUE-300) INJECTION 61% COMPARISON:  Chest CT exams dating back through 09/22/2015. FINDINGS: CT CHEST FINDINGS Cardiovascular: Borderline cardiomegaly with coronary arteriosclerosis along the left main, lad, RCA and circumflex. No pericardial effusion. Aortic atherosclerosis without aneurysmal dilatation or dissection. No large central pulmonary embolus is noted. Mediastinum/Nodes: Substernal extension of the thyroid gland with stable appearing benign-appearing subcentimeter hypodensities consistent with tiny nodules. Coarse calcification is seen a right internal thyroid gland. No mediastinal or hilar adenopathy. Trachea and mainstem bronchi as well as esophagus are normal in appearance. Lungs/Pleura: Stable right upper and right middle lobe nodules are noted, the right upper lobe nodules measuring between 5 and 6 mm on series 4 image 70 and 71 with right middle lobe subpleural nodules measuring 5 and 11 mm in maximum dimension, series 4, image 91 and 94. Centrilobular emphysema is noted in the upper lobes with superimposed airspace disease in the right upper lobe suspicious for concomitant pneumonia. Dependent atelectasis with subsegmental atelectasis noted in both lower lobes and lingula. No effusion or pneumothorax. Musculoskeletal: No chest wall mass or suspicious bone lesions identified. CT ABDOMEN PELVIS FINDINGS Hepatobiliary: No focal liver abnormality is seen. No gallstones, gallbladder wall thickening, or biliary dilatation. Pancreas:  Unremarkable. No pancreatic ductal dilatation or surrounding inflammatory changes. Spleen: Normal in size without focal abnormality. Adrenals/Urinary Tract: Hypodense 26, 18 and 11 nodules of the left adrenal gland are identified with Hounsfield units consistent with benign adenomas. Stable mild nodular thickening of the right adrenal gland. Mild bilateral renal cortical thinning. 12 mm water attenuating exophytic cyst off the interpolar right kidney. No nephrolithiasis, hydronephrosis nor hydroureter. Urinary bladder is unremarkable. Stomach/Bowel: The stomach is contracted. There is normal small bowel rotation without small-bowel obstruction or inflammation. The appendix is not visualized but no right lower quadrant or pericecal inflammation is seen. A moderate amount of stool noted within large bowel. Scattered colonic diverticulosis is noted without acute diverticulitis. Vascular/Lymphatic: Aortoiliac and branch vessel atherosclerosis. No aneurysm or dissection. Patent portal and splenic veins. Reproductive: Enlarged prostate measuring 5.1 x 6.4 x 5.3  cm. Other: Status post right inguinal hernia repair. Musculoskeletal: Degenerative disc disease at L5-S1. Right hip arthroplasty. No acute nor suspicious osseous lesions. IMPRESSION: 1. Stable right upper and right middle lobe subpleural pulmonary nodules largest is approximately 11 mm in the right middle lobe unchanged since 09/22/2015 CT. Given stability of the nodules, a future CT at 18-24 months (from initial scan performed 09/22/2015) is considered optional for low-risk patients, but is recommended for high-risk patients. This recommendation follows the consensus statement: Guidelines for Management of Incidental Pulmonary Nodules Detected on CT Images: From the Fleischner Society 2017; Radiology 2017; 284:228-243. 2. COPD with superimposed airspace disease in the right upper lobe suspicious for  pneumonia. 3. Mild thyromegaly with tiny subcentimeter hypodense  nodules without worrisome features, stable in appearance. 4. Stable left-sided adrenal nodules with enhancement consistent with benign adenomas. 5. Coronary arteriosclerosis and aortic atherosclerosis. 6. Tiny water attenuating 12 mm cyst off the interpolar right kidney. 7. Colonic diverticulosis without acute diverticulitis. No bowel inflammation or obstruction. 8. Enlarged prostate. 9. Status post right inguinal hernia repair and right hip arthroplasty Electronically Signed   By: Tollie Eth M.D.   On: 10/10/2016 19:20    Scheduled Meds: . amLODipine  10 mg Oral Daily  . aspirin EC  81 mg Oral Daily  . doxycycline  100 mg Oral BID WC  . enalapril  20 mg Oral BID  . enoxaparin (LOVENOX) injection  40 mg Subcutaneous Q24H  . loratadine  10 mg Oral Daily  . Melatonin  2.5 mg Oral QHS  . pantoprazole  40 mg Oral Daily  . potassium chloride SA  40 mEq Oral Daily  . pregabalin  75 mg Oral BID  . sotalol  80 mg Oral BID  . tamsulosin  0.8 mg Oral Daily  . torsemide  20 mg Oral Daily   Continuous Infusions: . cefTRIAXone (ROCEPHIN) IVPB 2 gram/50 mL D5W (Pyxis) Stopped (10/11/16 2130)    Assessment/Plan:  1. Right upper lobe pneumonia which is community-acquired. Placed on Rocephin and Doxycycline. No problems swallowing as per patient. Nebulizer treatments. 2. Pulmonary nodules stable from last appearance 3. Hypokalemia replace potassium orally 4. Chronic respiratory failure and COPD on oxygen. Patient chronically on 3 L of oxygen 5. Hypertension on Norvasc and enalapril 6. History of congestive heart failure. Unknown type. No signs of heart failure. Discontinue IV fluids at this point. 7. History of atrial fibrillation on sotalol. Aspirin only for anticoagulation. 8. Weakness. Physical therapy recommended rehabilitation. But some would like him back in [facility] with physical therapy there  Code Status:     Code Status Orders        Start     Ordered   10/10/16 2318  Do not  attempt resuscitation (DNR)  Continuous    Question Answer Comment  In the event of cardiac or respiratory ARREST Do not call a "code blue"   In the event of cardiac or respiratory ARREST Do not perform Intubation, CPR, defibrillation or ACLS   In the event of cardiac or respiratory ARREST Use medication by any route, position, wound care, and other measures to relive pain and suffering. May use oxygen, suction and manual treatment of airway obstruction as needed for comfort.      10/10/16 2317    Code Status History    Date Active Date Inactive Code Status Order ID Comments User Context   10/10/2016 10:11 PM 10/10/2016 11:17 PM Full Code [id number]  Tonye Royalty, DO Inpatient   11/22/2015  3:05 AM 11/22/2015  8:03 PM DNR [id number]  Tonye Royalty, DO Inpatient   09/22/2015 10:22 PM 09/25/2015  4:46 PM DNR [id number]  Oralia Manis, MD Inpatient    Advance Directive Documentation     Most Recent Value  Type of Advance Directive  Out of facility DNR (pink MOST or yellow form)  Pre-existing out of facility DNR order (yellow form or pink MOST form)  -  "MOST" Form in Place?  -     Family Communication: Spoke with son on the phone Disposition Plan: Physical therapy is recommended rehabilitation, but son would like to go back to [facility] with physical therapy there  Antibiotics:  Rocephin -  Doxycycline  Time spent: 25 minutes  Alford Highland  [employer]

## 2016-10-12 NOTE — Progress Notes (Signed)
Helped NT with patient change. Impulsive behavior today with trying to get out of bed. Patient comfortable, phone within reach. Hearing aids in place upon leaving room.

## 2016-10-12 NOTE — Clinical Social Work Note (Signed)
CSW received a phone call from the patient's son to discuss the patient's current status. The CSW advised Daniel Barr that the patient is now classified as inpatient rather than observation. Daniel Barr confirmed that he also would prefer the patient to return to [facility] with HHPT instead of discharge to SNF due to the patient's dementia. The CSW advised Daniel Barr of the patient's room number as Daniel Barr plans to visit with the patient this evening. CSW will continue to follow for discharge facilitation.  [name], MSW, Theresia MajorsLCSWA [phone]

## 2016-10-12 NOTE — Care Management Obs Status (Signed)
MEDICARE OBSERVATION STATUS NOTIFICATION   Patient Details  Name: Daniel Barr MRN: [id number] Date of Birth: 05/26/1931    Medicare Observation Status Notification Given:  Yes (moon letter)      Jolee Ewingockett,Caleb Prigmore A, RN 10/12/2016, 2:51 PM

## 2016-10-12 NOTE — Progress Notes (Signed)
Upon receipt of patient this morning, attempted to find patient's hearing aids in room.  Room searched, including bed, bathroom, nightstand, patient's belonging bag and underneath the bed. Unable to located patient's hearing aid. MD, Dr. Renae GlossWieting and K. White, Child psychotherapistocial Worker,  notified and made aware.

## 2016-10-13 LAB — BASIC METABOLIC PANEL
ANION GAP: 9 (ref 5–15)
BUN: 14 mg/dL (ref 6–20)
CO2: 28 mmol/L (ref 22–32)
Calcium: 8.8 mg/dL — ABNORMAL LOW (ref 8.9–10.3)
Chloride: 106 mmol/L (ref 101–111)
Creatinine, Ser: 0.87 mg/dL (ref 0.61–1.24)
GFR calc Af Amer: >60 mL/min (ref >60)
GFR calc non Af Amer: >60 mL/min (ref >60)
Glucose, Bld: 112 mg/dL — ABNORMAL HIGH (ref 65–99)
POTASSIUM: 3.3 mmol/L — AB (ref 3.5–5.1)
Sodium: 143 mmol/L (ref 135–145)

## 2016-10-13 LAB — CBC
HCT: 45.5 % (ref 40.0–52.0)
Hemoglobin: 15.7 g/dL (ref 13.0–18.0)
MCH: 30.4 pg (ref 26.0–34.0)
MCHC: 34.5 g/dL (ref 32.0–36.0)
MCV: 88.3 fL (ref 80.0–100.0)
Platelets: 302 10*3/uL (ref 150–440)
RBC: 5.15 MIL/uL (ref 4.40–5.90)
RDW: 14.6 % — AB (ref 11.5–14.5)
WBC: 14.8 10*3/uL — AB (ref 3.8–10.6)

## 2016-10-13 LAB — LEGIONELLA PNEUMOPHILA SEROGP 1 UR AG: L. pneumophila Serogp 1 Ur Ag: NEGATIVE

## 2016-10-13 MED ORDER — DOXYCYCLINE HYCLATE 100 MG PO TABS: 100.0000 mg | ORAL_TABLET | Freq: Two times a day (BID) | ORAL | 0 refills | Status: AC

## 2016-10-13 MED ORDER — CEFUROXIME AXETIL 500 MG PO TABS: 500.0000 mg | ORAL_TABLET | Freq: Two times a day (BID) | ORAL | 8 refills | Status: AC

## 2016-10-13 MED ORDER — CEFUROXIME AXETIL 500 MG PO TABS
500.0000 mg | ORAL_TABLET | Freq: Two times a day (BID) | ORAL | Status: DC
  Filled 2016-10-13: qty 1

## 2016-10-13 MED ORDER — POTASSIUM CHLORIDE CRYS ER 20 MEQ PO TBCR
40.0000 meq | EXTENDED_RELEASE_TABLET | Freq: Once | ORAL | Status: AC
  Administered 2016-10-13: 40 meq via ORAL
  Filled 2016-10-13: qty 2

## 2016-10-13 NOTE — Progress Notes (Signed)
Per MD patient is stable for D/C today. Clinical Child psychotherapistocial Worker (CSW) contacted [facility] and Personnel officerLaverne administrator reported that she will have to assess patient before he can return. Per Laverne she will [facility] to [facility] today around 10:15 am to assess patient. CSW contacted patient's son Tiney RougeDavid Findlay and made him aware of above. CSW made son aware that if [facility] can't take patient back then SNF will have to be pursued. Son verbalized his understanding and reported that he is [facility] at [facility] now and will try to stay until Laverne comes. CSW will continue to follow and assist as needed.   [employer] Hughes IncorporatedBailey Ndidi Nesby, LCSW [phone]

## 2016-10-13 NOTE — Progress Notes (Addendum)
[facility] employee came to assess patient and they are now sending their PT out to assess patient. [facility] PT has agreed to be in the room while [facility] PT is assessing. Clinical Social Worker (CSW) contacted patient's son Onalee HuaDavid and made him aware of above.   B[employer] Hughes IncorporatedBailey Shahida Schnackenberg, LCSW [phone]

## 2016-10-13 NOTE — Plan of Care (Signed)
Problem: Education: Goal: Knowledge of disease or condition will improve Outcome: Progressing Patient stated he felt better today, continues on O2

## 2016-10-13 NOTE — Progress Notes (Signed)
Physical Therapy Treatment Patient Details Name: Daniel Barr MRN: [id number] DOB: March 22, 1932 Today's Date: 10/13/2016    History of Present Illness 83 yo male with onset of PNA, from ALF [facility], used RW with ind at baseline.  PMHx:  R THA, lung and thyroid nodules, COPD, a-fib.    PT Comments    Pt agreeable to PT; denies pain. Pt extremely hard of hearing making instructions difficult. Pt demonstrates bed mobility with Min A/Min guard for lower extremities; uses rail for elevation of trunk. Pt requires cueing for safe hand placement for stand, as pt pulls from rolling walker needing several attempts to stand. Transfers performed several times throughout both sessions; pt demonstrates proper technique the last time with improved ability to stand with Min guard. Pt able to ambulate bed to chair twice as well as ambulate 30 then 70 ft. Pt with forward flexed posture and often rolling walker too far out in front of him, but no loss of balance. According to visiting outpatient therapist familiar with pt, ambulation pattern/quality is baseline for pt. Pt does require O2 though, as O2 saturation drops to 88% on room air with ambulation. Pt to discharge to home health PT to work on strength, endurance, balance and safety for improved functional mobility; SW aware of discharge plan change.     Follow Up Recommendations  Home health PT     Equipment Recommendations  None recommended by PT    Recommendations for Other Services       Precautions / Restrictions Precautions Precautions: Fall Restrictions Weight Bearing Restrictions: No    Mobility  Bed Mobility Overal bed mobility: Needs Assistance Bed Mobility: Supine to Sit     Supine to sit: Min assist;Min guard (heavy cueing for task initiation)        Transfers Overall transfer level: Needs assistance Equipment used: Rolling walker (2 wheeled) Transfers: Sit to/from Stand Sit to Stand: Min assist;Min guard          General transfer comment: cues for hands; improves with last transfer with familiar therapist in room  Ambulation/Gait Ambulation/Gait assistance: Min guard Ambulation Distance (Feet): 30 Feet (bed to/from chair x 2. Also 70 ft) Assistive device: Rolling walker (2 wheeled) Gait Pattern/deviations: Decreased step length - left     General Gait Details: Dan Humphreys often too far in front of pt. Per out pt PT, pt walking at baseline. Pt does desaturate to 88% with ambulation on room air    Stairs            Wheelchair Mobility    Modified Rankin (Stroke Patients Only)       Balance Overall balance assessment: Needs assistance Sitting-balance support: Feet supported Sitting balance-Leahy Scale: Good     Standing balance support: Bilateral upper extremity supported Standing balance-Leahy Scale: Fair                              Cognition Arousal/Alertness: Awake/alert Behavior During Therapy: WFL for tasks assessed/performed;Impulsive Overall Cognitive Status: History of cognitive impairments - at baseline                                 General Comments: extremely HOH; PT from ALF notes pt behavior/cognition is at baseline      Exercises      General Comments        Pertinent Vitals/Pain Pain Assessment: No/denies pain  Home Living                      Prior Function            PT Goals (current goals can now be found in the care plan section) Progress towards PT goals: Progressing toward goals    Frequency    Min 2X/week      PT Plan Discharge planning needs changed.    Co-evaluation              AM-PAC PT "6 Clicks" Daily Activity  Outcome Measure  Difficulty turning over in bed (including adjusting bedclothes, sheets and blankets)?: A Little Difficulty moving from lying on back to sitting on the side of the bed? : Total Difficulty sitting down on and standing up from a chair with arms (e.g.,  wheelchair, bedside commode, etc,.)?: Total Help needed moving to and from a bed to chair (including a wheelchair)?: A Little Help needed walking in hospital room?: A Little Help needed climbing 3-5 steps with a railing? : A Lot 6 Click Score: 13    End of Session Equipment Utilized During Treatment: Gait belt;Oxygen Activity Tolerance: Patient tolerated treatment well Patient left: in chair;with call bell/phone within reach;with chair alarm set Nurse Communication: Other (comment) (SW regarding session as well as OPPT from facility) PT Visit Diagnosis: Muscle weakness (generalized) (M62.81);Difficulty in walking, not elsewhere classified (R26.2)     Time: [id number] (pt also seen again from 1225 tp 1240 for additional gt charg) PT Time Calculation (min) (ACUTE ONLY): 23 min  Charges:  $Gait Training: 23-37 mins $Therapeutic Activity: 8-22 mins                    G Codes:  Functional Assessment Tool Used: AM-PAC 6 Clicks Basic Mobility;Clinical judgement     Scot DockHeidi E Barnes, PTA 10/13/2016, 1:18 PM

## 2016-10-13 NOTE — Discharge Instructions (Signed)
Consider thyroid ultrasound as outpatient for thyroid nodules  Community-Acquired Pneumonia, Adult Pneumonia is an infection of the lungs. There are different types of pneumonia. One type can develop while a person is in a hospital. A different type, called community-acquired pneumonia, develops in people who are not, or have not recently been, in the hospital or other health care facility. What are the causes? Pneumonia may be caused by bacteria, viruses, or funguses. Community-acquired pneumonia is often caused by Streptococcus pneumonia bacteria. These bacteria are often passed from one person to another by breathing in droplets from the cough or sneeze of an infected person. What increases the risk? The condition is more likely to develop in:  People who havechronic diseases, such as chronic obstructive pulmonary disease (COPD), asthma, congestive heart failure, cystic fibrosis, diabetes, or kidney disease.  People who haveearly-stage or late-stage HIV.  People who havesickle cell disease.  People who havehad their spleen removed (splenectomy).  People who havepoor Administratordental hygiene.  People who havemedical conditions that increase the risk of breathing in (aspirating) secretions their own mouth and nose.  People who havea weakened immune system (immunocompromised).  People who smoke.  People whotravel to areas where pneumonia-causing germs commonly exist.  People whoare around animal habitats or animals that have pneumonia-causing germs, including birds, bats, rabbits, cats, and farm animals.  What are the signs or symptoms? Symptoms of this condition include:  Adry cough.  A wet (productive) cough.  Fever.  Sweating.  Chest pain, especially when breathing deeply or coughing.  Rapid breathing or difficulty breathing.  Shortness of breath.  Shaking chills.  Fatigue.  Muscle aches.  How is this diagnosed? Your health care provider will take a medical  history and perform a physical exam. You may also have other tests, including:  Imaging studies of your chest, including X-rays.  Tests to check your blood oxygen level and other blood gases.  Other tests on blood, mucus (sputum), fluid around your lungs (pleural fluid), and urine.  If your pneumonia is severe, other tests may be done to identify the specific cause of your illness. How is this treated? The type of treatment that you receive depends on many factors, such as the cause of your pneumonia, the medicines you take, and other medical conditions that you have. For most adults, treatment and recovery from pneumonia may occur at home. In some cases, treatment must happen in a hospital. Treatment may include:  Antibiotic medicines, if the pneumonia was caused by bacteria.  Antiviral medicines, if the pneumonia was caused by a virus.  Medicines that are given by mouth or through an IV tube.  Oxygen.  Respiratory therapy.  Although rare, treating severe pneumonia may include:  Mechanical ventilation. This is done if you are not breathing well on your own and you cannot maintain a safe blood oxygen level.  Thoracentesis. This procedureremoves fluid around one lung or both lungs to help you breathe better.  Follow these instructions at home:  Take over-the-counter and prescription medicines only as told by your health care provider. ? Only takecough medicine if you are losing sleep. Understand that cough medicine can prevent your bodys natural ability to remove mucus from your lungs. ? If you were prescribed an antibiotic medicine, take it as told by your health care provider. Do not stop taking the antibiotic even if you start to feel better.  Sleep in a semi-upright position at night. Try sleeping in a reclining chair, or place a few pillows under your head.  Do not use tobacco products, including cigarettes, chewing tobacco, and e-cigarettes. If you need help quitting, ask  your health care provider.  Drink enough water to keep your urine clear or pale yellow. This will help to thin out mucus secretions in your lungs. How is this prevented? There are ways that you can decrease your risk of developing community-acquired pneumonia. Consider getting a pneumococcal vaccine if:  You are older than 81 years of age.  You are older than 81 years of age and are undergoing cancer treatment, have chronic lung disease, or have other medical conditions that affect your immune system. Ask your health care provider if this applies to you.  There are different types and schedules of pneumococcal vaccines. Ask your health care provider which vaccination option is best for you. You may also prevent community-acquired pneumonia if you take these actions:  Get an influenza vaccine every year. Ask your health care provider which type of influenza vaccine is best for you.  Go to the dentist on a regular basis.  Wash your hands often. Use hand sanitizer if soap and water are not available.  Contact a health care provider if:  You have a fever.  You are losing sleep because you cannot control your cough with cough medicine. Get help right away if:  You have worsening shortness of breath.  You have increased chest pain.  Your sickness becomes worse, especially if you are an older adult or have a weakened immune system.  You cough up blood. This information is not intended to replace advice given to you by your health care provider. Make sure you discuss any questions you have with your health care provider. Document Released: 04/21/2005 Document Revised: 08/30/2015 Document Reviewed: 08/16/2014 Elsevier Interactive Patient Education  2017 [employer].

## 2016-10-13 NOTE — Progress Notes (Signed)
Pt having loud inspiratory snoring, even while awake. O.S.A.?

## 2016-10-13 NOTE — Care Management (Signed)
PT recommending; [facility] to evaluate before patient can return to their facility. [facility] given heads up in case they will. RNCM will follow along with CSW.

## 2016-10-13 NOTE — Progress Notes (Addendum)
96% on 3Liters via nasal cannula, HR 72, manual pulse @70 

## 2016-10-13 NOTE — Care Management (Addendum)
O2 sat assessment will not qualify patient for home O2; patient must show recovery on O2 and heart rate. RN updated. CSW updated. Patient has nocturnal O2 at home through [facility] per csw.  I spoke with [facility] and they state that patient is supposed to be on chronic O2 anyways. I explained that facility is stating he doesn't have a portable tank however per [facility] patient should have a portable tank. RN/CSW updated.

## 2016-10-13 NOTE — Progress Notes (Signed)
Called report to [employer]olaLaverne (Financial controllerresident manager). Answered all questions. Called EMS for transport.

## 2016-10-13 NOTE — Discharge Summary (Addendum)
[facility] [place] [facility]   PATIENT NAME: Daniel Barr    MR#:  [id number]  DATE OF BIRTH:  07/28/1931  DATE OF ADMISSION:  10/10/2016 ADMITTING PHYSICIAN: Tonye Royalty, DO  DATE OF DISCHARGE: 10/13/2016  PRIMARY CARE PHYSICIAN: Drs. making house calls   ADMISSION DIAGNOSIS:  Leukocytosis, unspecified type [D72.829] Community acquired pneumonia of left upper lobe of lung (HCC) [J18.1]  DISCHARGE DIAGNOSIS:  Active Problems:   Community acquired pneumonia   Pneumonia   SECONDARY DIAGNOSIS:   Past Medical History:  Diagnosis Date  . A-fib (HCC)   . CHF (congestive heart failure) (HCC)   . GERD (gastroesophageal reflux disease)   . Hard of hearing   . HLD (hyperlipidemia)   . Hypertension   . Nerve pain     HOSPITAL COURSE:   1.  Right upper lobe pneumonia which is community-acquired. Patient was placed on Rocephin and doxycycline. The patient states he does not have any problems swallowing. Patient was given nebulizer treatments during the hospital course. Patient was switched over to Ceftin and doxycycline upon going home. Patient moving better air upon going home. The patient always has this snorting snoring type of breathing. 2. Pulmonary nodule stable from last appearance 3. Thyroid nodules. Recommend a ultrasound of the thyroid as outpatient 4. Hypokalemia. Replace potassium orally 5. Acute on chronic Chronic respiratory failure and COPD on 3 L of oxygen chronically. Oxygen order an prescription written. 6. Essential hypertension on Norvasc and enalapril 7. History of congestive heart failure. Unknown type. No signs of heart failure on this hospitalization. 8. Atrial fibrillation on sotalol and aspirin only for anticoagulation 9. Weakness. Physical therapy recommended rehabilitation. In speaking with the son, he would like the patient to go back to [facility] with physical therapy there. Home health set up.  DISCHARGE CONDITIONS:    Satisfactory  CONSULTS OBTAINED:   none  DRUG ALLERGIES:  No Known Allergies  DISCHARGE MEDICATIONS:   Current Discharge Medication List    START taking these medications   Details  cefUROXime (CEFTIN) 500 MG tablet Take 1 tablet (500 mg total) by mouth 2 (two) times daily with a meal. Qty: 8 tablet, Refills: 8    doxycycline (VIBRA-TABS) 100 MG tablet Take 1 tablet (100 mg total) by mouth 2 (two) times daily with a meal. Qty: 8 tablet, Refills: 0      CONTINUE these medications which have NOT CHANGED   Details  acetaminophen (TYLENOL) 650 MG CR tablet Take 650 mg by mouth every 8 (eight) hours as needed for pain.    amLODipine (NORVASC) 10 MG tablet Take 1 tablet (10 mg total) by mouth daily. Qty: 30 tablet, Refills: 0    aspirin EC 81 MG tablet Take 1 tablet (81 mg total) by mouth daily. Qty: 30 tablet, Refills: 0    enalapril (VASOTEC) 20 MG tablet Take 1 tablet (20 mg total) by mouth 2 (two) times daily. Qty: 60 tablet, Refills: 0    fexofenadine (ALLEGRA) 180 MG tablet Take 180 mg by mouth daily.    ibuprofen (ADVIL,MOTRIN) 400 MG tablet Take 400 mg by mouth 2 (two) times daily.    LORazepam (ATIVAN) 0.5 MG tablet Take 0.5 mg by mouth 2 (two) times daily as needed for anxiety.    MELATONIN PO Take 3 mg by mouth at bedtime.    Multiple Vitamins-Minerals (PRESERVISION AREDS 2 PO) Take 1 capsule by mouth daily.    omeprazole (PRILOSEC) 20 MG capsule Take 1 capsule (20  mg total) by mouth 2 (two) times daily. Qty: 30 capsule, Refills: 0    potassium chloride SA (K-DUR,KLOR-CON) 20 MEQ tablet Take 2 tablets (40 mEq total) by mouth daily. Take with Torsemide. Qty: 60 tablet, Refills: 0    pregabalin (LYRICA) 75 MG capsule Take 75 mg by mouth 2 (two) times daily.    sotalol (BETAPACE) 80 MG tablet Take 1 tablet (80 mg total) by mouth 2 (two) times daily. Qty: 60 tablet, Refills: 0    tamsulosin (FLOMAX) 0.4 MG CAPS capsule Take 2 capsules (0.8 mg total) by  mouth daily. Qty: 30 capsule, Refills: 0    torsemide (DEMADEX) 20 MG tablet Take 1 tablet (20 mg total) by mouth daily. Qty: 30 tablet, Refills: 0    loperamide (IMODIUM) 2 MG capsule Take 2 mg by mouth as needed for diarrhea or loose stools.      STOP taking these medications     cephALEXin (KEFLEX) 500 MG capsule          DISCHARGE INSTRUCTIONS:   Follow-up with Drs. making house calls in 1 week  If you experience worsening of your admission symptoms, develop shortness of breath, life threatening emergency, suicidal or homicidal thoughts you must seek medical attention immediately by calling 911 or calling your MD immediately  if symptoms less severe.  You Must read complete instructions/literature along with all the possible adverse reactions/side effects for all the Medicines you take and that have been prescribed to you. Take any new Medicines after you have completely understood and accept all the possible adverse reactions/side effects.   Please note  You were cared for by a hospitalist during your hospital stay. If you have any questions about your discharge medications or the care you received while you were in the hospital after you are discharged, you can call the unit and asked to speak with the hospitalist on call if the hospitalist that took care of you is not available. Once you are discharged, your primary care physician will handle any further medical issues. Please note that NO REFILLS for any discharge medications will be authorized once you are discharged, as it is imperative that you return to your primary care physician (or establish a relationship with a primary care physician if you do not have one) for your aftercare needs so that they can reassess your need for medications and monitor your lab values.    Today   CHIEF COMPLAINT:   Chief Complaint  Patient presents with  . Weakness    HISTORY OF PRESENT ILLNESS:  Daniel Barr  is a 83 y.o. male  presented with weakness and found to have pneumonia   VITAL SIGNS:  Blood pressure 132/72, pulse 66, temperature 98.7 F (37.1 C), temperature source Oral, resp. rate (!) 21, height 5\' 7"  (1.702 m), weight 99.8 kg (220 lb), SpO2 91 %.   Respirations 16 on my count    PHYSICAL EXAMINATION:  GENERAL:  83 y.o.-year-old patient lying in the bed with no acute distress.  EYES: Pupils equal, round, reactive to light and accommodation. No scleral icterus. Extraocular muscles intact.  HEENT: Head atraumatic, normocephalic. Oropharynx and nasopharynx clear.  NECK:  Supple, no jugular venous distention. No thyroid enlargement, no tenderness.  LUNGS: Decreased breath sounds bilaterally, no wheezing, rales,rhonchi or crepitation. No use of accessory muscles of respiration.  CARDIOVASCULAR: S1, S2 normal. No murmurs, rubs, or gallops.  ABDOMEN: Soft, non-tender, non-distended. Bowel sounds present. No organomegaly or mass.  EXTREMITIES: Trace edema, no cyanosis,  or clubbing.  NEUROLOGIC: Cranial nerves II through XII are intact. Muscle strength 5/5 in all extremities. Sensation intact. Gait not checked.  PSYCHIATRIC: The patient is alert and oriented x 3.  SKIN: No obvious rash, lesion, or ulcer.   DATA REVIEW:   CBC  Recent Labs Lab 10/13/16 0340  WBC 14.8*  HGB 15.7  HCT 45.5  PLT 302    Chemistries   Recent Labs Lab 10/13/16 0340  NA 143  K 3.3*  CL 106  CO2 28  GLUCOSE 112*  BUN 14  CREATININE 0.87  CALCIUM 8.8*     Microbiology Results  Results for orders placed or performed during the hospital encounter of 10/10/16  Urine culture     Status: None   Collection Time: 10/10/16  4:38 PM  Result Value Ref Range Status   Specimen Description URINE, RANDOM  Final   Special Requests NONE  Final   Culture   Final    NO GROWTH Performed at [facility], [address], [place], [place] [address]    Report Status 10/12/2016 FINAL  Final  MRSA PCR Screening      Status: None   Collection Time: 10/11/16  6:32 AM  Result Value Ref Range Status   MRSA by PCR NEGATIVE NEGATIVE Final    Comment:        The GeneXpert MRSA Assay (FDA approved for NASAL specimens only), is one component of a comprehensive MRSA colonization surveillance program. It is not intended to diagnose MRSA infection nor to guide or monitor treatment for MRSA infections.       Management plans discussed with the patient, family and they are in agreement.  CODE STATUS:     Code Status Orders        Start     Ordered   10/10/16 2318  Do not attempt resuscitation (DNR)  Continuous    Question Answer Comment  In the event of cardiac or respiratory ARREST Do not call a "code blue"   In the event of cardiac or respiratory ARREST Do not perform Intubation, CPR, defibrillation or ACLS   In the event of cardiac or respiratory ARREST Use medication by any route, position, wound care, and other measures to relive pain and suffering. May use oxygen, suction and manual treatment of airway obstruction as needed for comfort.      10/10/16 2317    Code Status History    Date Active Date Inactive Code Status Order ID Comments User Context   10/10/2016 10:11 PM 10/10/2016 11:17 PM Full Code [id number]  Tonye Royalty, DO Inpatient   11/22/2015  3:05 AM 11/22/2015  8:03 PM DNR [id number]  Tonye Royalty, DO Inpatient   09/22/2015 10:22 PM 09/25/2015  4:46 PM DNR [id number]  Oralia Manis, MD Inpatient    Advance Directive Documentation     Most Recent Value  Type of Advance Directive  Out of facility DNR (pink MOST or yellow form)  Pre-existing out of facility DNR order (yellow form or pink MOST form)  -  "MOST" Form in Place?  -      TOTAL TIME TAKING CARE OF THIS PATIENT: 35 minutes.    Alford Highland M.D on 10/13/2016 at 9:26 AM  Between 7am to 6pm - Pager - [phone]  After 6pm go to [email or web address] - password [email or web address]  [facility] Office   [phone]  CC: Primary care physician; Doctors making house calls

## 2016-10-13 NOTE — [place] FL2 (Signed)
[place] MEDICAID FL2 LEVEL OF CARE SCREENING TOOL     IDENTIFICATION  Patient Name: Daniel Barr Birthdate: [birth year] Sex: male Admission Date (Current Location): 10/10/2016  [place] Number:  Chiropodist and Address:  [facility], [address] [place], [place] [address]      Provider Number: [id number]  Attending Physician Name and Address:  Alford Highland, MD  Relative Name and Phone Number:       Current Level of Care: Hospital Recommended Level of Care: Assisted Living Facility Prior Approval Number:    Date Approved/Denied:   PASRR Number: [id number]  Discharge Plan: Domiciliary (Rest home)    Current Diagnoses: Patient Active Problem List   Diagnosis Date Noted  . Pneumonia 10/11/2016  . Community acquired pneumonia 10/10/2016  . UTI (urinary tract infection) 11/22/2015  . Acute encephalopathy 09/22/2015  . HTN (hypertension) 09/22/2015  . A-fib (HCC) 09/22/2015  . GERD (gastroesophageal reflux disease) 09/22/2015    Orientation RESPIRATION BLADDER Height & Weight     Self  O2 (3L) Continent Weight: 220 lb (99.8 kg) Height:  5\' 7"  (170.2 cm)  BEHAVIORAL SYMPTOMS/MOOD NEUROLOGICAL BOWEL NUTRITION STATUS      Continent Diet (Heart Healthy)  AMBULATORY STATUS COMMUNICATION OF NEEDS Skin   Limited Assist  Verbally Normal                       Personal Care Assistance Level of Assistance  Bathing, Feeding, Dressing Bathing Assistance: Limited assistance Feeding assistance: Independent Dressing Assistance: Limited assistance     Functional Limitations Info  Hearing   Hearing Info: Impaired      SPECIAL CARE FACTORS FREQUENCY  PT (By licensed PT)     PT Frequency: As specified by Home Health practitioner              Contractures Contractures Info: Present    Additional Factors Info  Code Status Code Status Info: DNR            Discharge Medications: Please see discharge  summary for a list of discharge medications. DISCHARGE MEDICATIONS:       Current Discharge Medication List        START taking these medications   Details  cefUROXime (CEFTIN) 500 MG tablet Take 1 tablet (500 mg total) by mouth 2 (two) times daily with a meal. Qty: 8 tablet, Refills: 8    doxycycline (VIBRA-TABS) 100 MG tablet Take 1 tablet (100 mg total) by mouth 2 (two) times daily with a meal. Qty: 8 tablet, Refills: 0          CONTINUE these medications which have NOT CHANGED   Details  acetaminophen (TYLENOL) 650 MG CR tablet Take 650 mg by mouth every 8 (eight) hours as needed for pain.    amLODipine (NORVASC) 10 MG tablet Take 1 tablet (10 mg total) by mouth daily. Qty: 30 tablet, Refills: 0    aspirin EC 81 MG tablet Take 1 tablet (81 mg total) by mouth daily. Qty: 30 tablet, Refills: 0    enalapril (VASOTEC) 20 MG tablet Take 1 tablet (20 mg total) by mouth 2 (two) times daily. Qty: 60 tablet, Refills: 0    fexofenadine (ALLEGRA) 180 MG tablet Take 180 mg by mouth daily.    ibuprofen (ADVIL,MOTRIN) 400 MG tablet Take 400 mg by mouth 2 (two) times daily.    LORazepam (ATIVAN) 0.5 MG tablet Take 0.5 mg by mouth 2 (two) times daily as  needed for anxiety.    MELATONIN PO Take 3 mg by mouth at bedtime.    Multiple Vitamins-Minerals (PRESERVISION AREDS 2 PO) Take 1 capsule by mouth daily.    omeprazole (PRILOSEC) 20 MG capsule Take 1 capsule (20 mg total) by mouth 2 (two) times daily. Qty: 30 capsule, Refills: 0    potassium chloride SA (K-DUR,KLOR-CON) 20 MEQ tablet Take 2 tablets (40 mEq total) by mouth daily. Take with Torsemide. Qty: 60 tablet, Refills: 0    pregabalin (LYRICA) 75 MG capsule Take 75 mg by mouth 2 (two) times daily.    sotalol (BETAPACE) 80 MG tablet Take 1 tablet (80 mg total) by mouth 2 (two) times daily. Qty: 60 tablet, Refills: 0    tamsulosin (FLOMAX) 0.4 MG CAPS capsule Take 2 capsules (0.8 mg total) by mouth  daily. Qty: 30 capsule, Refills: 0    torsemide (DEMADEX) 20 MG tablet Take 1 tablet (20 mg total) by mouth daily. Qty: 30 tablet, Refills: 0    loperamide (IMODIUM) 2 MG capsule Take 2 mg by mouth as needed for diarrhea or loose stools.         STOP taking these medications     cephALEXin (KEFLEX) 500 MG capsule       Relevant Imaging Results: Relevant Lab Results: Additional Information SS#[id number]  [name], Darleen CrockerBailey M, [place]

## 2016-10-13 NOTE — Progress Notes (Signed)
[facility] PT assessed patient and reported that he is at his baseline and can return to [facility] with home health PT and oxygen. RN case manager arranged oxygen through [facility]. Per Personnel officerLaverne administrator at [facility] patient has oxygen at night and can return today. [facility] director Ivar DrapeCindy Boone reported that patient can return today. Clinical Child psychotherapistocial Worker (CSW) sent D/C orders to [facility]. Patient's son Onalee HuaDavid is aware of above and requested EMS for transport. RN will arrange EMS and call report to [facility]. Please reconsult if future social work needs arise. CSW signing off.   Baker Hughes IncorporatedBailey Tifanny Dollens, LCSW [phone]

## 2016-10-13 NOTE — Progress Notes (Signed)
SATURATION QUALIFICATIONS: (This note is used to comply with regulatory documentation for home oxygen)  Patient Saturations on Room Air at Rest = 90%  Patient Saturations on Room Air while Ambulating = 88%  Patient Saturations on 0 Liters of oxygen while Ambulating = 88%  Please briefly explain why patient needs home oxygen: Patient ambulated just into hallway, 88% on RA, HR in 140s.

## 2016-10-13 NOTE — Care Management (Addendum)
This RNCM notified by CSW that patient will need home health and new home O2 despite nebulizer treatment. Per physician note: "1.  Right upper lobe pneumonia which is community-acquired. Patient was placed on Rocephin and doxycycline. The patient states he does not have any problems swallowing. Patient was given nebulizer treatments during the hospital course. Patient was switched over to Ceftin and doxycycline upon going home. Patient moving better air upon going home. The patient always has this snorting snoring type of breathing". I have updated Tim with [facility] of this need and Barbara CowerJason with [facility] of need for home O2. RN to assess O2 need.

## 2016-10-16 ENCOUNTER — Encounter: Payer: Self-pay | Admitting: Emergency Medicine

## 2016-10-16 ENCOUNTER — Inpatient Hospital Stay
Admission: EM | Admit: 2016-10-16 | Discharge: 2016-11-02 | DRG: 682 | Disposition: E | Payer: Medicare Other | Attending: Internal Medicine | Admitting: Internal Medicine

## 2016-10-16 ENCOUNTER — Emergency Department: Payer: Medicare Other

## 2016-10-16 DIAGNOSIS — R0602 Shortness of breath: Secondary | ICD-10-CM

## 2016-10-16 DIAGNOSIS — J440 Chronic obstructive pulmonary disease with acute lower respiratory infection: Secondary | ICD-10-CM | POA: Diagnosis present

## 2016-10-16 DIAGNOSIS — N179 Acute kidney failure, unspecified: Principal | ICD-10-CM | POA: Diagnosis present

## 2016-10-16 DIAGNOSIS — Z66 Do not resuscitate: Secondary | ICD-10-CM | POA: Diagnosis present

## 2016-10-16 DIAGNOSIS — G4710 Hypersomnia, unspecified: Secondary | ICD-10-CM | POA: Diagnosis present

## 2016-10-16 DIAGNOSIS — F0390 Unspecified dementia without behavioral disturbance: Secondary | ICD-10-CM | POA: Diagnosis present

## 2016-10-16 DIAGNOSIS — E785 Hyperlipidemia, unspecified: Secondary | ICD-10-CM | POA: Diagnosis present

## 2016-10-16 DIAGNOSIS — H9190 Unspecified hearing loss, unspecified ear: Secondary | ICD-10-CM | POA: Diagnosis present

## 2016-10-16 DIAGNOSIS — Z515 Encounter for palliative care: Secondary | ICD-10-CM | POA: Diagnosis present

## 2016-10-16 DIAGNOSIS — I110 Hypertensive heart disease with heart failure: Secondary | ICD-10-CM | POA: Diagnosis present

## 2016-10-16 DIAGNOSIS — R531 Weakness: Secondary | ICD-10-CM | POA: Diagnosis not present

## 2016-10-16 DIAGNOSIS — Z79899 Other long term (current) drug therapy: Secondary | ICD-10-CM

## 2016-10-16 DIAGNOSIS — J189 Pneumonia, unspecified organism: Secondary | ICD-10-CM | POA: Diagnosis present

## 2016-10-16 DIAGNOSIS — R627 Adult failure to thrive: Secondary | ICD-10-CM | POA: Diagnosis present

## 2016-10-16 DIAGNOSIS — Z791 Long term (current) use of non-steroidal anti-inflammatories (NSAID): Secondary | ICD-10-CM

## 2016-10-16 DIAGNOSIS — Z87891 Personal history of nicotine dependence: Secondary | ICD-10-CM

## 2016-10-16 DIAGNOSIS — Z7982 Long term (current) use of aspirin: Secondary | ICD-10-CM

## 2016-10-16 DIAGNOSIS — E860 Dehydration: Secondary | ICD-10-CM | POA: Diagnosis present

## 2016-10-16 DIAGNOSIS — I4891 Unspecified atrial fibrillation: Secondary | ICD-10-CM | POA: Diagnosis present

## 2016-10-16 DIAGNOSIS — R911 Solitary pulmonary nodule: Secondary | ICD-10-CM | POA: Diagnosis present

## 2016-10-16 DIAGNOSIS — R918 Other nonspecific abnormal finding of lung field: Secondary | ICD-10-CM | POA: Diagnosis present

## 2016-10-16 DIAGNOSIS — Z8701 Personal history of pneumonia (recurrent): Secondary | ICD-10-CM

## 2016-10-16 DIAGNOSIS — J9621 Acute and chronic respiratory failure with hypoxia: Secondary | ICD-10-CM | POA: Diagnosis present

## 2016-10-16 DIAGNOSIS — R4182 Altered mental status, unspecified: Secondary | ICD-10-CM | POA: Diagnosis present

## 2016-10-16 DIAGNOSIS — Z9981 Dependence on supplemental oxygen: Secondary | ICD-10-CM

## 2016-10-16 DIAGNOSIS — K219 Gastro-esophageal reflux disease without esophagitis: Secondary | ICD-10-CM | POA: Diagnosis present

## 2016-10-16 DIAGNOSIS — I5032 Chronic diastolic (congestive) heart failure: Secondary | ICD-10-CM | POA: Diagnosis present

## 2016-10-16 LAB — COMPREHENSIVE METABOLIC PANEL
ALBUMIN: 3.7 g/dL (ref 3.5–5.0)
ALT: 20 U/L (ref 17–63)
AST: 22 U/L (ref 15–41)
Alkaline Phosphatase: 67 U/L (ref 38–126)
Anion gap: 7 (ref 5–15)
BUN: 37 mg/dL — AB (ref 6–20)
CO2: 26 mmol/L (ref 22–32)
CREATININE: 1.92 mg/dL — AB (ref 0.61–1.24)
Calcium: 8.6 mg/dL — ABNORMAL LOW (ref 8.9–10.3)
Chloride: 107 mmol/L (ref 101–111)
GFR calc Af Amer: 36 mL/min — ABNORMAL LOW (ref >60)
GFR calc non Af Amer: 31 mL/min — ABNORMAL LOW (ref >60)
Glucose, Bld: 116 mg/dL — ABNORMAL HIGH (ref 65–99)
POTASSIUM: 4.3 mmol/L (ref 3.5–5.1)
SODIUM: 140 mmol/L (ref 135–145)
Total Bilirubin: 0.7 mg/dL (ref 0.3–1.2)
Total Protein: 7 g/dL (ref 6.5–8.1)

## 2016-10-16 LAB — CBC WITH DIFFERENTIAL/PLATELET
BASOS ABS: 0.1 10*3/uL (ref 0–0.1)
BASOS PCT: 1 %
EOS ABS: 0.2 10*3/uL (ref 0–0.7)
Eosinophils Relative: 2 %
HCT: 44.4 % (ref 40.0–52.0)
Hemoglobin: 14.8 g/dL (ref 13.0–18.0)
LYMPHS PCT: 16 %
Lymphs Abs: 1.8 10*3/uL (ref 1.0–3.6)
MCH: 30.1 pg (ref 26.0–34.0)
MCHC: 33.3 g/dL (ref 32.0–36.0)
MCV: 90.4 fL (ref 80.0–100.0)
Monocytes Absolute: 1 10*3/uL (ref 0.2–1.0)
Monocytes Relative: 9 %
Neutro Abs: 8 10*3/uL — ABNORMAL HIGH (ref 1.4–6.5)
Neutrophils Relative %: 72 %
Platelets: 299 10*3/uL (ref 150–440)
RBC: 4.91 MIL/uL (ref 4.40–5.90)
RDW: 15 % — ABNORMAL HIGH (ref 11.5–14.5)
WBC: 11.1 10*3/uL — AB (ref 3.8–10.6)

## 2016-10-16 LAB — URINALYSIS, ROUTINE W REFLEX MICROSCOPIC
Bilirubin Urine: NEGATIVE
GLUCOSE, UA: NEGATIVE mg/dL
Hgb urine dipstick: NEGATIVE
Ketones, ur: NEGATIVE mg/dL
Leukocytes, UA: NEGATIVE
NITRITE: NEGATIVE
PH: 5 (ref 5.0–8.0)
PROTEIN: NEGATIVE mg/dL
Specific Gravity, Urine: 1.016 (ref 1.005–1.030)

## 2016-10-16 LAB — LIPASE, BLOOD: Lipase: 43 U/L (ref 11–51)

## 2016-10-16 LAB — TROPONIN I: Troponin I: <0.03 ng/mL (ref <0.03)

## 2016-10-16 LAB — LACTIC ACID, PLASMA: Lactic Acid, Venous: 1.1 mmol/L (ref 0.5–1.9)

## 2016-10-16 MED ORDER — CEFUROXIME AXETIL 500 MG PO TABS
500.0000 mg | ORAL_TABLET | Freq: Two times a day (BID) | ORAL | Status: DC
  Administered 2016-10-17 – 2016-10-19 (×6): 500 mg via ORAL
  Filled 2016-10-16 (×8): qty 1

## 2016-10-16 MED ORDER — PREGABALIN 75 MG PO CAPS
75.0000 mg | ORAL_CAPSULE | Freq: Two times a day (BID) | ORAL | Status: DC
  Administered 2016-10-16 – 2016-10-17 (×2): 75 mg via ORAL
  Filled 2016-10-16 (×2): qty 1

## 2016-10-16 MED ORDER — ENOXAPARIN SODIUM 40 MG/0.4ML ~~LOC~~ SOLN
40.0000 mg | SUBCUTANEOUS | Status: DC
  Administered 2016-10-16 – 2016-10-19 (×4): 40 mg via SUBCUTANEOUS
  Filled 2016-10-16 (×4): qty 0.4

## 2016-10-16 MED ORDER — ONDANSETRON HCL 4 MG PO TABS: 4.0000 mg | ORAL_TABLET | Freq: Four times a day (QID) | ORAL | Status: DC | PRN

## 2016-10-16 MED ORDER — SODIUM CHLORIDE 0.9 % IV SOLN
INTRAVENOUS | Status: AC
  Administered 2016-10-17: 03:00:00 via INTRAVENOUS

## 2016-10-16 MED ORDER — LORAZEPAM 0.5 MG PO TABS
0.5000 mg | ORAL_TABLET | Freq: Two times a day (BID) | ORAL | Status: DC | PRN
  Administered 2016-10-17 (×2): 0.5 mg via ORAL
  Filled 2016-10-16 (×2): qty 1

## 2016-10-16 MED ORDER — SODIUM CHLORIDE 0.9 % IV BOLUS (SEPSIS)
1000.0000 mL | INTRAVENOUS | Status: AC
  Administered 2016-10-16: 1000 mL via INTRAVENOUS

## 2016-10-16 MED ORDER — DOCUSATE SODIUM 100 MG PO CAPS
100.0000 mg | ORAL_CAPSULE | Freq: Two times a day (BID) | ORAL | Status: DC
  Administered 2016-10-16 – 2016-10-21 (×9): 100 mg via ORAL
  Filled 2016-10-16 (×9): qty 1

## 2016-10-16 MED ORDER — ACETAMINOPHEN 325 MG PO TABS
650.0000 mg | ORAL_TABLET | Freq: Four times a day (QID) | ORAL | Status: DC | PRN
  Administered 2016-10-17 – 2016-10-18 (×3): 650 mg via ORAL
  Filled 2016-10-16 (×3): qty 2

## 2016-10-16 MED ORDER — POLYETHYLENE GLYCOL 3350 17 G PO PACK: 17.0000 g | PACK | Freq: Every day | ORAL | Status: DC | PRN

## 2016-10-16 MED ORDER — ALBUTEROL SULFATE (2.5 MG/3ML) 0.083% IN NEBU: 2.5000 mg | INHALATION_SOLUTION | RESPIRATORY_TRACT | Status: DC | PRN

## 2016-10-16 MED ORDER — LORATADINE 10 MG PO TABS
10.0000 mg | ORAL_TABLET | Freq: Every day | ORAL | Status: DC
  Administered 2016-10-17 – 2016-10-19 (×3): 10 mg via ORAL
  Filled 2016-10-16 (×3): qty 1

## 2016-10-16 MED ORDER — PANTOPRAZOLE SODIUM 40 MG PO TBEC
40.0000 mg | DELAYED_RELEASE_TABLET | Freq: Every day | ORAL | Status: DC
  Administered 2016-10-17 – 2016-10-21 (×4): 40 mg via ORAL
  Filled 2016-10-16 (×4): qty 1

## 2016-10-16 MED ORDER — POTASSIUM CHLORIDE CRYS ER 20 MEQ PO TBCR
40.0000 meq | EXTENDED_RELEASE_TABLET | Freq: Every day | ORAL | Status: DC
  Administered 2016-10-17 – 2016-10-21 (×4): 40 meq via ORAL
  Filled 2016-10-16 (×4): qty 2

## 2016-10-16 MED ORDER — TAMSULOSIN HCL 0.4 MG PO CAPS
0.8000 mg | ORAL_CAPSULE | Freq: Every day | ORAL | Status: DC
  Administered 2016-10-17 – 2016-10-21 (×4): 0.8 mg via ORAL
  Filled 2016-10-16 (×4): qty 2

## 2016-10-16 MED ORDER — ACETAMINOPHEN 650 MG RE SUPP: 650.0000 mg | Freq: Four times a day (QID) | RECTAL | Status: DC | PRN

## 2016-10-16 MED ORDER — LOPERAMIDE HCL 2 MG PO CAPS: 2.0000 mg | ORAL_CAPSULE | ORAL | Status: DC | PRN

## 2016-10-16 MED ORDER — SOTALOL HCL 80 MG PO TABS
80.0000 mg | ORAL_TABLET | Freq: Two times a day (BID) | ORAL | Status: DC
  Administered 2016-10-16 – 2016-10-21 (×9): 80 mg via ORAL
  Filled 2016-10-16 (×11): qty 1

## 2016-10-16 MED ORDER — ONDANSETRON HCL 4 MG/2ML IJ SOLN: 4.0000 mg | Freq: Four times a day (QID) | INTRAMUSCULAR | Status: DC | PRN

## 2016-10-16 MED ORDER — AMLODIPINE BESYLATE 10 MG PO TABS
10.0000 mg | ORAL_TABLET | Freq: Every day | ORAL | Status: DC
  Administered 2016-10-17 – 2016-10-19 (×3): 10 mg via ORAL
  Filled 2016-10-16 (×3): qty 1

## 2016-10-16 MED ORDER — ASPIRIN EC 81 MG PO TBEC
81.0000 mg | DELAYED_RELEASE_TABLET | Freq: Every day | ORAL | Status: DC
  Administered 2016-10-17 – 2016-10-21 (×4): 81 mg via ORAL
  Filled 2016-10-16 (×5): qty 1

## 2016-10-16 MED ORDER — DOXYCYCLINE HYCLATE 100 MG PO TABS
100.0000 mg | ORAL_TABLET | Freq: Two times a day (BID) | ORAL | Status: DC
  Administered 2016-10-17 – 2016-10-19 (×6): 100 mg via ORAL
  Filled 2016-10-16 (×6): qty 1

## 2016-10-16 NOTE — ED Notes (Signed)
UA collected at this time.

## 2016-10-16 NOTE — ED Notes (Signed)
Dr. Sudini at bedside at this time.  

## 2016-10-16 NOTE — H&P (Signed)
[facility] [place] [facility]   PATIENT NAME: Daniel Barr    MR#:  [id number]  DATE OF BIRTH:  11-21-1931  DATE OF ADMISSION:  10/05/2016  PRIMARY CARE PHYSICIAN: Derwood Kaplan, MD   REQUESTING/REFERRING PHYSICIAN: Dr. Cyril Loosen  CHIEF COMPLAINT:   Chief Complaint  Patient presents with  . Fall  . Fatigue    HISTORY OF PRESENT ILLNESS:  Daniel Barr  is a 83 y.o. male with a known history of CHF, atrial fibrillation, COPD, chronic respiratory failure who was recently in the hospital for right-sided pneumonia and discharged to rehabilitation returns due to worsening weakness and fall. He has been found to have acute kidney injury. Decreased oral intake. He shouldn't is hard of hearing and seems to be mildly confused. History obtained from old records and nursing staff at bedside. He was in cefuroxime and doxycycline for pneumonia. Urinalysis shows no WBC or bacteria.  PAST MEDICAL HISTORY:   Past Medical History:  Diagnosis Date  . A-fib (HCC)   . CHF (congestive heart failure) (HCC)   . GERD (gastroesophageal reflux disease)   . Hard of hearing   . HLD (hyperlipidemia)   . Hypertension   . Nerve pain     PAST SURGICAL HISTORY:   Past Surgical History:  Procedure Laterality Date  . COLONOSCOPY    . JOINT REPLACEMENT      SOCIAL HISTORY:   Social History  Substance Use Topics  . Smoking status: Former Games developer  . Smokeless tobacco: Never Used  . Alcohol use Yes    FAMILY HISTORY:   Family History  Problem Relation Age of Onset  . Stroke Mother   . Hypertension Unknown   . Stroke Unknown   . Cancer Unknown     DRUG ALLERGIES:  No Known Allergies  REVIEW OF SYSTEMS:   Review of Systems  Unable to perform ROS: Mental status change    MEDICATIONS AT HOME:   Prior to Admission medications   Medication Sig Start Date End Date Taking? Authorizing Provider  acetaminophen (TYLENOL) 650 MG CR tablet Take 650 mg by mouth every 8  (eight) hours as needed for pain.   Yes [provider]  amLODipine (NORVASC) 10 MG tablet Take 1 tablet (10 mg total) by mouth daily. 09/25/15  Yes Shaune Pollack, MD  aspirin EC 81 MG tablet Take 1 tablet (81 mg total) by mouth daily. 09/25/15  Yes Shaune Pollack, MD  cefUROXime (CEFTIN) 500 MG tablet Take 1 tablet (500 mg total) by mouth 2 (two) times daily with a meal. 10/13/16  Yes Renae Gloss, Richard, MD  doxycycline (VIBRA-TABS) 100 MG tablet Take 1 tablet (100 mg total) by mouth 2 (two) times daily with a meal. 10/13/16  Yes Wieting, Richard, MD  enalapril (VASOTEC) 20 MG tablet Take 1 tablet (20 mg total) by mouth 2 (two) times daily. 09/25/15  Yes Shaune Pollack, MD  fexofenadine (ALLEGRA) 180 MG tablet Take 180 mg by mouth daily.   Yes [provider]  ibuprofen (ADVIL,MOTRIN) 400 MG tablet Take 400 mg by mouth 2 (two) times daily.   Yes [provider]  loperamide (IMODIUM) 2 MG capsule Take 2 mg by mouth as needed for diarrhea or loose stools.   Yes [provider]  LORazepam (ATIVAN) 0.5 MG tablet Take 0.5 mg by mouth 2 (two) times daily as needed for anxiety.   Yes [provider]  MELATONIN PO Take 3 mg by mouth at bedtime.   Yes [provider]  Multiple Vitamins-Minerals (PRESERVISION AREDS 2 PO) Take 1 capsule by mouth daily.   Yes [provider]  omeprazole (PRILOSEC) 20 MG capsule Take 1 capsule (20 mg total) by mouth 2 (two) times daily. 09/25/15  Yes Shaune Pollackhen, Qing, MD  potassium chloride SA (K-DUR,KLOR-CON) 20 MEQ tablet Take 2 tablets (40 mEq total) by mouth daily. Take with Torsemide. 11/22/15  Yes Burdette Forehand, Wardell HeathSrikar, MD  pregabalin (LYRICA) 75 MG capsule Take 75 mg by mouth 2 (two) times daily.   Yes [provider]  sotalol (BETAPACE) 80 MG tablet Take 1 tablet (80 mg total) by mouth 2 (two) times daily. 09/25/15  Yes Shaune Pollackhen, Qing, MD  tamsulosin (FLOMAX) 0.4 MG CAPS capsule Take 2 capsules (0.8 mg total) by mouth daily. 09/25/15  Yes  Shaune Pollackhen, Qing, MD  torsemide (DEMADEX) 20 MG tablet Take 1 tablet (20 mg total) by mouth daily. 09/25/15  Yes Shaune Pollackhen, Qing, MD     VITAL SIGNS:  Blood pressure 116/63, pulse (!) 54, temperature 98.2 F (36.8 C), temperature source Oral, resp. rate 20, height 5\' 7"  (1.702 m), weight 99.8 kg (220 lb), SpO2 93 %.  PHYSICAL EXAMINATION:  Physical Exam  GENERAL:  83 y.o.-year-old patient lying in the bed with no acute distress. Decreased hearing EYES: Pupils equal, round, reactive to light and accommodation. No scleral icterus. Extraocular muscles intact.  HEENT: Head atraumatic, normocephalic. Oropharynx and nasopharynx clear. No oropharyngeal erythema, moist oral dry NECK:  Supple, no jugular venous distention. No thyroid enlargement, no tenderness.  LUNGS: Normal breath sounds bilaterally, no wheezing, rales, rhonchi. No use of accessory muscles of respiration.  CARDIOVASCULAR: S1, S2 normal. No murmurs, rubs, or gallops.  ABDOMEN: Soft, nontender, nondistended. Bowel sounds present. No organomegaly or mass.  EXTREMITIES: No pedal edema, cyanosis, or clubbing. + 2 pedal & radial pulses b/l.   NEUROLOGIC: Moves all 4 extremities. Not following instructions. PSYCHIATRIC: The patient is drowsy. SKIN: No obvious rash, lesion, or ulcer.   LABORATORY PANEL:   CBC  Recent Labs Lab 10/27/2016 1642  WBC 11.1*  HGB 14.8  HCT 44.4  PLT 299   ------------------------------------------------------------------------------------------------------------------  Chemistries   Recent Labs Lab 10/08/2016 1642  NA 140  K 4.3  CL 107  CO2 26  GLUCOSE 116*  BUN 37*  CREATININE 1.92*  CALCIUM 8.6*  AST 22  ALT 20  ALKPHOS 67  BILITOT 0.7   ------------------------------------------------------------------------------------------------------------------  Cardiac Enzymes  Recent Labs Lab 10/06/2016 1642  TROPONINI <0.03    ------------------------------------------------------------------------------------------------------------------  RADIOLOGY:  Dg Chest 2 View  Result Date: 10/07/2016 CLINICAL DATA:  83 year old male with weakness. Recent chest CT appearance suspicious for acute infectious exacerbation superimposed on chronic lung disease. EXAM: CHEST  2 VIEW COMPARISON:  Chest CT 10/10/2016 and earlier. FINDINGS: Semi upright AP and lateral views of the chest. Stable lung volumes. Calcified aortic atherosclerosis. Stable cardiomegaly and mediastinal contours. No pneumothorax. No pleural effusion. No consolidation. Chronic increased peripheral and widespread interstitial opacity appears stable since 2017. The patchy superimposed peribronchial opacity on the recent CT is subtle. No areas of worsening ventilation. No acute osseous abnormality identified. Negative visible bowel gas pattern. IMPRESSION: 1. Stable to regressed patchy right upper lung opacity superimposed on chronic lung disease since the chest CT 10/10/2016. 2. No new cardiopulmonary abnormality. 3.  Calcified aortic atherosclerosis. Electronically Signed   By: Odessa FlemingH  Hall M.D.   On: 10/14/2016 16:27     IMPRESSION AND PLAN:   * Acute kidney injury likely due to decreased oral  intake. We'll start IV fluids. Monitor input and output. Stop Vasotec, ibuprofen and torsemide. Repeat labs in the morning. Avoid nephrotoxic medications.  * Right sided pneumonia. Patient is on cefuroxime and doxycycline. Chest x-ray shows improvement. Afebrile. Chronically on 3 L oxygen which is stable. We'll continue oral antibiotics.  * Chronic diastolic CHF. Diuretics on hold due to dehydration and renal failure. Monitor for fluid overload.  * Chronic respiratory failure is stable  * DVT prophylaxis with Lovenox which is renally dosed  All the records are reviewed and case discussed with ED provider. Management plans discussed with the patient, family and they are in  agreement.  CODE STATUS: DO NOT RESUSCITATE  TOTAL TIME TAKING CARE OF THIS PATIENT: 40 minutes.   Milagros Loll R M.D on 2016/10/31 at 6:08 PM  Between 7am to 6pm - Pager - [phone]  After 6pm go to [email or web address] - password [email or web address] [facility]  [facility] [place] [facility]  Office  [phone]  CC: Primary care physician; Derwood Kaplan, MD  Note: This dictation was prepared with Dragon dictation along with smaller phrase technology. Any transcriptional errors that result from this process are unintentional.

## 2016-10-16 NOTE — ED Notes (Signed)
Pt resting in bed at this time. No change in patient condition. Pt continues to rest in bed, snoring noted, remains in 3L via [place] at this time. Occasionally wakes up and asks for his shoes then goes back to sleep.

## 2016-10-16 NOTE — ED Provider Notes (Signed)
[facility] Emergency Department Provider Note  ____________________________________________   First MD Initiated Contact with Patient 10/19/2016 1549     (approximate)  I have reviewed the triage vital signs and the nursing notes.   HISTORY  Chief Complaint Fall and Fatigue  Level 5 caveat:  history/ROS limited by chronic dementia  HPI Daniel Barr is a 83 y.o. male with a medical history that includes dementia and who was discharged from the hospital 3 days ago after an admission for a mild community-acquired pneumonia who presents by EMS for evaluation of generalized weakness.  According to EMS, they were told by the facility that the patient is in his usual state of health.  He always uses 3 L of oxygen and has been and no acute distress.  He seems to be at his baseline mental status.  He has frequent snoring respirations at baseline.  Reportedly he had an episode of weakness where he tried to ambulate and dropped to his knees due to the weakness, so he was seen by a physician at the facility (the name is not available) and that physician told the staff to call EMS to have him evaluated in the emergency department.  The staff was not certain what was to be evaluated other than the generalized weakness.  The patient has been getting his prescribed antibiotics after his discharge.  No other details are available.  The patient is very hard of hearing but when I ask him loudly how he feels, he stated "pretty good".   Past Medical History:  Diagnosis Date  . A-fib (HCC)   . CHF (congestive heart failure) (HCC)   . GERD (gastroesophageal reflux disease)   . Hard of hearing   . HLD (hyperlipidemia)   . Hypertension   . Nerve pain     Patient Active Problem List   Diagnosis Date Noted  . AKI (acute kidney injury) (HCC) 10/24/2016  . Pneumonia 10/11/2016  . Community acquired pneumonia 10/10/2016  . UTI (urinary tract infection) 11/22/2015  . Acute  encephalopathy 09/22/2015  . HTN (hypertension) 09/22/2015  . A-fib (HCC) 09/22/2015  . GERD (gastroesophageal reflux disease) 09/22/2015    Past Surgical History:  Procedure Laterality Date  . COLONOSCOPY    . JOINT REPLACEMENT      Prior to Admission medications   Medication Sig Start Date End Date Taking? Authorizing Provider  acetaminophen (TYLENOL) 650 MG CR tablet Take 650 mg by mouth every 8 (eight) hours as needed for pain.   Yes [provider]  amLODipine (NORVASC) 10 MG tablet Take 1 tablet (10 mg total) by mouth daily. 09/25/15  Yes Shaune Pollack, MD  aspirin EC 81 MG tablet Take 1 tablet (81 mg total) by mouth daily. 09/25/15  Yes Shaune Pollack, MD  cefUROXime (CEFTIN) 500 MG tablet Take 1 tablet (500 mg total) by mouth 2 (two) times daily with a meal. 10/13/16  Yes Renae Gloss, Richard, MD  doxycycline (VIBRA-TABS) 100 MG tablet Take 1 tablet (100 mg total) by mouth 2 (two) times daily with a meal. 10/13/16  Yes Wieting, Richard, MD  enalapril (VASOTEC) 20 MG tablet Take 1 tablet (20 mg total) by mouth 2 (two) times daily. 09/25/15  Yes Shaune Pollack, MD  fexofenadine (ALLEGRA) 180 MG tablet Take 180 mg by mouth daily.   Yes [provider]  ibuprofen (ADVIL,MOTRIN) 400 MG tablet Take 400 mg by mouth 2 (two) times daily.   Yes [provider]  loperamide (IMODIUM) 2 MG  capsule Take 2 mg by mouth as needed for diarrhea or loose stools.   Yes [provider]  LORazepam (ATIVAN) 0.5 MG tablet Take 0.5 mg by mouth 2 (two) times daily as needed for anxiety.   Yes [provider]  MELATONIN PO Take 3 mg by mouth at bedtime.   Yes [provider]  Multiple Vitamins-Minerals (PRESERVISION AREDS 2 PO) Take 1 capsule by mouth daily.   Yes [provider]  omeprazole (PRILOSEC) 20 MG capsule Take 1 capsule (20 mg total) by mouth 2 (two) times daily. 09/25/15  Yes Shaune Pollack, MD  potassium chloride SA (K-DUR,KLOR-CON) 20 MEQ tablet Take 2  tablets (40 mEq total) by mouth daily. Take with Torsemide. 11/22/15  Yes Sudini, Wardell Heath, MD  pregabalin (LYRICA) 75 MG capsule Take 75 mg by mouth 2 (two) times daily.   Yes [provider]  sotalol (BETAPACE) 80 MG tablet Take 1 tablet (80 mg total) by mouth 2 (two) times daily. 09/25/15  Yes Shaune Pollack, MD  tamsulosin (FLOMAX) 0.4 MG CAPS capsule Take 2 capsules (0.8 mg total) by mouth daily. 09/25/15  Yes Shaune Pollack, MD  torsemide (DEMADEX) 20 MG tablet Take 1 tablet (20 mg total) by mouth daily. 09/25/15  Yes Shaune Pollack, MD    Allergies Patient has no known allergies.  Family History  Problem Relation Age of Onset  . Stroke Mother   . Hypertension Unknown   . Stroke Unknown   . Cancer Unknown     Social History Social History  Substance Use Topics  . Smoking status: Former Games developer  . Smokeless tobacco: Never Used  . Alcohol use Yes    Review of Systems Level 5 caveat:  history/ROS limited by chronic dementia ____________________________________________   PHYSICAL EXAM:  ED Triage Vitals  Enc Vitals Group     BP 11/02/2016 1637 116/63     Pulse Rate 11/02/16 1637 (!) 54     Resp 11/02/2016 1637 20     Temp 11/02/16 1637 98.2 F (36.8 C)     Temp Source 11-02-16 1637 Oral     SpO2 11/02/2016 1558 92 %     Weight Nov 02, 2016 1600 99.8 kg (220 lb)     Height 11/02/16 1600 1.702 m (5\' 7" )     Head Circumference --      Peak Flow --      Pain Score --      Pain Loc --      Pain Edu? --      Excl. in GC? --      Constitutional: Elderly, hard of hearing but alert.  No acute distress on his chronic 3L of O2. Eyes: Conjunctivae are normal. Chronic left injected conjunctiva. Head: Atraumatic. Nose: No congestion/rhinnorhea. Mouth/Throat: Mucous membranes are moist. Neck: No stridor.  No meningeal signs.   Cardiovascular: Normal rate, regular rhythm. Good peripheral circulation. Grossly normal heart sounds. Respiratory: Normal respiratory effort.  No retractions. Lungs  CTAB. Gastrointestinal: Soft and nontender. No distention.  Musculoskeletal: No lower extremity tenderness nor edema. No gross deformities of extremities. Neurologic:  Patient knows his name, otherwise is confused, but according to staff and EMS this is his baseline.  He is pleasant, hard of hearing, and shows no sign of acute focal neurological deficits. Skin:  Skin is warm, dry and intact. No rash noted.  ____________________________________________   LABS (all labs ordered are listed, but only abnormal results are displayed)  Labs Reviewed  CBC WITH DIFFERENTIAL/PLATELET - Abnormal; Notable for  the following:       Result Value   WBC 11.1 (*)    RDW 15.0 (*)    Neutro Abs 8.0 (*)    All other components within normal limits  COMPREHENSIVE METABOLIC PANEL - Abnormal; Notable for the following:    Glucose, Bld 116 (*)    BUN 37 (*)    Creatinine, Ser 1.92 (*)    Calcium 8.6 (*)    GFR calc non Af Amer 31 (*)    GFR calc Af Amer 36 (*)    All other components within normal limits  URINALYSIS, ROUTINE W REFLEX MICROSCOPIC - Abnormal; Notable for the following:    Color, Urine YELLOW (*)    APPearance HAZY (*)    All other components within normal limits  LIPASE, BLOOD  TROPONIN I  LACTIC ACID, PLASMA  COMPREHENSIVE METABOLIC PANEL  CBC   ____________________________________________  EKG  ED ECG REPORT I, Jaylenn Baiza, the attending physician, personally viewed and interpreted this ECG.  Date: 02/25/2017 EKG Time: 16:34 Rate: 52 Rhythm: Sinus bradycardia QRS Axis: normal Intervals: normal ST/T Wave abnormalities: Non-specific ST segment / T-wave changes, but no evidence of acute ischemia. Narrative Interpretation: no evidence of acute ischemia   ____________________________________________  RADIOLOGY   Dg Chest 2 View  Result Date: Apr 25, 2017 CLINICAL DATA:  83 year old male with weakness. Recent chest CT appearance suspicious for acute infectious  exacerbation superimposed on chronic lung disease. EXAM: CHEST  2 VIEW COMPARISON:  Chest CT 10/10/2016 and earlier. FINDINGS: Semi upright AP and lateral views of the chest. Stable lung volumes. Calcified aortic atherosclerosis. Stable cardiomegaly and mediastinal contours. No pneumothorax. No pleural effusion. No consolidation. Chronic increased peripheral and widespread interstitial opacity appears stable since 2017. The patchy superimposed peribronchial opacity on the recent CT is subtle. No areas of worsening ventilation. No acute osseous abnormality identified. Negative visible bowel gas pattern. IMPRESSION: 1. Stable to regressed patchy right upper lung opacity superimposed on chronic lung disease since the chest CT 10/10/2016. 2. No new cardiopulmonary abnormality. 3.  Calcified aortic atherosclerosis. Electronically Signed   By: Odessa FlemingH  Hall M.D.   On: 02/25/2017 16:27    ____________________________________________   PROCEDURES  Critical Care performed: No   Procedure(s) performed:   Procedures   ____________________________________________   INITIAL IMPRESSION / ASSESSMENT AND PLAN / ED COURSE  Pertinent labs & imaging results that were available during my care of the patient were reviewed by me and considered in my medical decision making (see chart for details).  I reviewed the patient's records and [facility] from his recent hospital admission including his discharge summary.  He was discharged on antibiotics for community-acquired pneumonia.  His pneumonia was not initially seen on chest x-ray but rather caught on CT scan of his chest.  I will perform a broad workup of lab work, EKG, and a 2 view chest x-ray to check for any interval changes from the last one.  He is not currently in any distress.   Clinical Course as of Oct 16 1937  Thu Oct 16, 2016  1742 The patient's creatinine is significantly elevated today at nearly 2 with a GFR down to 31.  His creatinine is normally less than  or equal to 1.  This is consistent with his increasing weakness, inability to walk without falling, and decreased oral intake.  I will discuss the case with the hospitalist for admission and I have ordered some IV bolus fluids. Creatinine: (!) 1.92 [CF]    Clinical Course  User Index [CF] Loleta Rose, MD    ____________________________________________  FINAL CLINICAL IMPRESSION(S) / ED DIAGNOSES  Final diagnoses:  Acute renal failure, unspecified acute renal failure type (HCC)  Generalized weakness  Dementia without behavioral disturbance, unspecified dementia type     MEDICATIONS GIVEN DURING THIS VISIT:  Medications  enoxaparin (LOVENOX) injection 40 mg (not administered)  acetaminophen (TYLENOL) tablet 650 mg (not administered)    Or  acetaminophen (TYLENOL) suppository 650 mg (not administered)  docusate sodium (COLACE) capsule 100 mg (not administered)  polyethylene glycol (MIRALAX / GLYCOLAX) packet 17 g (not administered)  ondansetron (ZOFRAN) tablet 4 mg (not administered)    Or  ondansetron (ZOFRAN) injection 4 mg (not administered)  albuterol (PROVENTIL) (2.5 MG/3ML) 0.083% nebulizer solution 2.5 mg (not administered)  0.9 %  sodium chloride infusion (not administered)  cefUROXime (CEFTIN) tablet 500 mg (not administered)  doxycycline (VIBRA-TABS) tablet 100 mg (not administered)  loratadine (CLARITIN) tablet 10 mg (not administered)  LORazepam (ATIVAN) tablet 0.5 mg (not administered)  pregabalin (LYRICA) capsule 75 mg (not administered)  loperamide (IMODIUM) capsule 2 mg (not administered)  potassium chloride SA (K-DUR,KLOR-CON) CR tablet 40 mEq (not administered)  amLODipine (NORVASC) tablet 10 mg (not administered)  aspirin EC tablet 81 mg (not administered)  pantoprazole (PROTONIX) EC tablet 40 mg (not administered)  sotalol (BETAPACE) tablet 80 mg (not administered)  tamsulosin (FLOMAX) capsule 0.8 mg (not administered)  sodium chloride 0.9 % bolus 1,000 mL  (1,000 mLs Intravenous New Bag/Given 11-10-16 1753)     NEW OUTPATIENT MEDICATIONS STARTED DURING THIS VISIT:  New Prescriptions   No medications on file    Modified Medications   No medications on file    Discontinued Medications   No medications on file     Note:  This document was prepared using Dragon voice recognition software and may include unintentional dictation errors.    Loleta Rose, MD 11-10-16 [id number]

## 2016-10-16 NOTE — ED Notes (Signed)
Pt pulled out IV at this time. Pt only received approx 500cc of fluids before pulling out his IV. Pt also pulled off all of his leads, BP cuff, and O2 sensor and was attempting to get out of bed. New IV started by Denny PeonErin, [employer]. Dawn, RN on 2C called to update on patient condition.

## 2016-10-16 NOTE — ED Notes (Signed)
Patient trying to get out of bed. Redirected patient.

## 2016-10-16 NOTE — ED Notes (Signed)
Patient took nasal cannula off. Currently at 94% room air.

## 2016-10-16 NOTE — ED Notes (Signed)
High fall risk bracelet applied and bed alarm applied to patient at this time.

## 2016-10-16 NOTE — ED Notes (Signed)
Patient trying to pull out IV. Patient redirected. resecured IV.

## 2016-10-16 NOTE — ED Triage Notes (Signed)
Pt presents to ED via [facility] with c/o fall and generalized leg weakness from [place] [facility]. Per EMS pt was seen several days ago for the same. EMS states that patient fell to his knees earlier today and was evaluated by MD at [facility] who sent patient for evaluation. Pt does not appear to be in any acute distress, noted to be resting in comfortably, on chronic O2 at 3L, with snoring respirations, per EMS that is patient's baseline. EMS also reports pt is currently being treated for pneumonia.

## 2016-10-16 NOTE — ED Notes (Signed)
Pt visualized resting in bed, this RN updated by Waldo LaineKaitlin, RN that patient had become agitated while this RN was off the floor. Pt now resting comfortably in bed, calm and cooperative at this time.

## 2016-10-17 LAB — COMPREHENSIVE METABOLIC PANEL
ALT: 20 U/L (ref 17–63)
AST: 22 U/L (ref 15–41)
Albumin: 3.6 g/dL (ref 3.5–5.0)
Alkaline Phosphatase: 65 U/L (ref 38–126)
Anion gap: 4 — ABNORMAL LOW (ref 5–15)
BUN: 28 mg/dL — AB (ref 6–20)
CHLORIDE: 114 mmol/L — AB (ref 101–111)
CO2: 28 mmol/L (ref 22–32)
CREATININE: 1.28 mg/dL — AB (ref 0.61–1.24)
Calcium: 8.6 mg/dL — ABNORMAL LOW (ref 8.9–10.3)
GFR calc Af Amer: 58 mL/min — ABNORMAL LOW (ref >60)
GFR, EST NON AFRICAN AMERICAN: 50 mL/min — AB (ref >60)
GLUCOSE: 96 mg/dL (ref 65–99)
Potassium: 3.6 mmol/L (ref 3.5–5.1)
Sodium: 146 mmol/L — ABNORMAL HIGH (ref 135–145)
Total Bilirubin: 0.9 mg/dL (ref 0.3–1.2)
Total Protein: 6.9 g/dL (ref 6.5–8.1)

## 2016-10-17 LAB — CBC
HCT: 44.6 % (ref 40.0–52.0)
Hemoglobin: 14.8 g/dL (ref 13.0–18.0)
MCH: 29.8 pg (ref 26.0–34.0)
MCHC: 33.2 g/dL (ref 32.0–36.0)
MCV: 90 fL (ref 80.0–100.0)
PLATELETS: 300 10*3/uL (ref 150–440)
RBC: 4.96 MIL/uL (ref 4.40–5.90)
RDW: 14.8 % — AB (ref 11.5–14.5)
WBC: 10.6 10*3/uL (ref 3.8–10.6)

## 2016-10-17 MED ORDER — QUETIAPINE FUMARATE 25 MG PO TABS
12.5000 mg | ORAL_TABLET | Freq: Every day | ORAL | Status: DC
  Administered 2016-10-17 – 2016-10-19 (×3): 12.5 mg via ORAL
  Filled 2016-10-17 (×3): qty 1

## 2016-10-17 MED ORDER — HALOPERIDOL LACTATE 5 MG/ML IJ SOLN
1.0000 mg | Freq: Four times a day (QID) | INTRAMUSCULAR | Status: DC | PRN
Start: 2016-10-17 — End: 2016-10-20
  Administered 2016-10-17 – 2016-10-18 (×2): 1 mg via INTRAVENOUS
  Filled 2016-10-17 (×2): qty 1

## 2016-10-17 NOTE — Clinical Social Work Note (Signed)
Clinical Social Work Assessment  Patient Details  Name: Daniel Barr MRN: [id number] Date of Birth: 10/25/1931  Date of referral:  10/17/16               Reason for consult:  Facility Placement                Permission sought to share information with:    Permission granted to share information::   (patient confused to person place time situation)  Name::        Agency::     Relationship::     Contact Information:     Housing/Transportation Living arrangements for the past 2 months:  Assisted Living Facility Source of Information:  Adult Children Patient Interpreter Needed:  None Criminal Activity/Legal Involvement Pertinent to Current Situation/Hospitalization:  No - Comment as needed Significant Relationships:  Adult Children Lives with:  Facility Resident Do you feel safe going back to the place where you live?  Yes Need for family participation in patient care:  Yes (Comment)  Care giving concerns:  Patient resides at [facility].   Social Worker assessment / plan:  CSW received consult that [facility] is from [facility]. PT has assessed patient and they are recommending STR. CSW spoke with Laverne at [facility] and she stated that if PT is still recommending rehab that they will want patient to go to rehab prior to returning to [facility]. CSW spoke with patient's son this afternoon in patient's room. Patient is requiring a sitter due to confusion at this time and will need to be without a sitter for 24 hours prior to being discharged. CSW introduced self and explained purpose of visit. Patient's son is agreeable for rehab and the bed search process was explained to him.   Employment status:  Retired Database administratornsurance information:  Managed Medicare PT Recommendations:  Skilled Nursing Facility Information / Referral to community resources:     Patient/Family's Response to care:  Patient's son expressed appreciation for CSW assistance.  Patient/Family's Understanding of  and Emotional Response to Diagnosis, Current Treatment, and Prognosis:  Patient's son is aware of patient's limitations and is agreeable to STR bed search.   Emotional Assessment Appearance:  Appears stated age Attitude/Demeanor/Rapport:  Unable to Assess Affect (typically observed):  Unable to Assess Orientation:  Fluctuating Orientation (Suspected and/or reported Sundowners) Alcohol / Substance use:  Not Applicable Psych involvement (Current and /or in the community):  No (Comment)  Discharge Needs  Concerns to be addressed:  Care Coordination Readmission within the last 30 days:  Yes Current discharge risk:  None Barriers to Discharge:  No Barriers Identified   York SpanielMonica Liisa Picone, LCSW 10/17/2016, 2:03 PM

## 2016-10-17 NOTE — Evaluation (Signed)
Clinical/Bedside Swallow Evaluation Patient Details  Name: Daniel Barr MRN: [id number] Date of Birth: 05/12/1931  Today's Date: 10/17/2016 Time: SLP Start Time (ACUTE ONLY): 1150 SLP Stop Time (ACUTE ONLY): 1250 SLP Time Calculation (min) (ACUTE ONLY): 60 min  Past Medical History:  Past Medical History:  Diagnosis Date  . A-fib (HCC)   . CHF (congestive heart failure) (HCC)   . GERD (gastroesophageal reflux disease)   . Hard of hearing   . HLD (hyperlipidemia)   . Hypertension   . Nerve pain    Past Surgical History:  Past Surgical History:  Procedure Laterality Date  . COLONOSCOPY    . JOINT REPLACEMENT     HPI:  Pt is a 83 y.o. male with a known history of CHF, atrial fibrillation, COPD, chronic respiratory failure who was recently in the hospital for right-sided pneumonia and discharged to rehabilitation returns due to worsening weakness and fall. He has been found to have acute kidney injury. Decreased oral intake. Previous head CT revealed moderate to severe chronic small vessel ischemic disease - unsure of pt's baseline Cognitive status. Pt is also extremely HOH; hearing aids were lost. Per this chest xray, stable to regressed patchy right upper lung opacity superimposed on chronic lung disease since the chest CT 10/10/2016 is noted.    Assessment / Plan / Recommendation Clinical Impression  Pt appears to present w/ min oropharyngeal phase dysphagia secondary to declined Cognitive status currently. However, when he was given po trials, he appeared at reduced risk for aspiration when following general aspiration precautions including sitting upright and eating/drinking small, single bites and sip - slowly. Pt does exhibit increased respiratory effort as a mouth breather/snorer at rest; this does not overtly increase w/ the effort of oral intake. Pt was reluctant to take po's w/ SLP d/t having just eaten bites of a hotdog and milkshake brought in by Son per CNA. Pt exhibited no  overt s/s of aspiration w/ the few trials he took w/ SLP and none were noted when eating w/ Son/CNA per her report. Pt presents w/ declined Cognitive status currently; unsure of his baseline Cognitive presentation. Noted no further decline in this CXR compared to previous admission; pt has had a reduced appetite per report but seems to enjoy milkshakes. Recommend a Dysphagia level 3 for easier mastication of meats; Thin liquids. General aspiration precautions; Pills in Puree for easier swallowing. Assistance and monitoring at all meals for follow through w/ precautions. NSG updated.  SLP Visit Diagnosis: Dysphagia, oropharyngeal phase (R13.12)    Aspiration Risk  Mild aspiration risk (but reduced when following aspiration precautions)    Diet Recommendation  Dysphagia level 3(cut meats) w/ Thin liquids; general aspiration precautions and assistance at meals d/t Confusion, weakness  Medication Administration: Whole meds with puree (Crushed in puree if needed per NSG)    Other  Recommendations Recommended Consults:  (Dietician consult) Oral Care Recommendations: Oral care BID;Staff/trained caregiver to provide oral care   Follow up Recommendations None      Frequency and Duration min 2x/week  1 week       Prognosis Prognosis for Safe Diet Advancement: Good Barriers to Reach Goals: Cognitive deficits      Swallow Study   General Date of Onset: November 26, 2016 HPI: Pt is a 83 y.o. male with a known history of CHF, atrial fibrillation, COPD, chronic respiratory failure who was recently in the hospital for right-sided pneumonia and discharged to rehabilitation returns due to worsening weakness and fall. He has been  found to have acute kidney injury. Decreased oral intake. Previous head CT revealed moderate to severe chronic small vessel ischemic disease - unsure of pt's baseline Cognitive status. Pt is also extremely HOH; hearing aids were lost. Per this chest xray, stable to regressed patchy right  upper lung opacity superimposed on chronic lung disease since the chest CT 10/10/2016 is noted.  Type of Study: Bedside Swallow Evaluation Previous Swallow Assessment: none indicated Diet Prior to this Study: Regular;Thin liquids (per MD order) Temperature Spikes Noted: No (wbc 10.6) Respiratory Status:  (has been on [place] 3 liters) History of Recent Intubation: No Behavior/Cognition: Alert;Cooperative;Pleasant mood;Confused;Distractible;Requires cueing (HOH) Oral Cavity Assessment: Dry Oral Care Completed by SLP: Recent completion by staff Oral Cavity - Dentition: Missing dentition Vision: Functional for self-feeding (fair) Self-Feeding Abilities: Able to feed self;Needs assist;Needs set up;Total assist Patient Positioning: Upright in bed Baseline Vocal Quality: Normal;Low vocal intensity (mumbled speech) Volitional Cough: Cognitively unable to elicit Volitional Swallow: Unable to elicit    Oral/Motor/Sensory Function Overall Oral Motor/Sensory Function: Within functional limits (w/ bolus management)   Ice Chips Ice chips: Within functional limits Presentation: Spoon (fed; 2 trials)   Thin Liquid Thin Liquid: Within functional limits Presentation: Self Fed;Straw (assisted; 4-5 trials) Other Comments: also consumed milkshake w/ CNA w/ no overt s/s of aspiration noted    Nectar Thick Nectar Thick Liquid: Not tested   Honey Thick Honey Thick Liquid: Not tested   Puree Puree: Within functional limits Presentation: Spoon (fed; 3 trials)   Solid   GO   Solid: Within functional limits Presentation:  (assisted w/ eating a hotdog brought in by Son per CNA) Other Comments: pt consumed 2-3 bites w/ no overt s/s of oral phase deficits described - bites were smaller. Pt declined any further trials w/ SLP.    Functional Assessment Tool Used: clinical judgement Functional Limitations: Swallowing Swallow Current Status (Z6109): At least 1 percent but less than 20 percent impaired, limited or  restricted Swallow Goal Status [phone]): At least 1 percent but less than 20 percent impaired, limited or restricted Swallow Discharge Status [phone]): At least 1 percent but less than 20 percent impaired, limited or restricted    Jerilynn Som, MS, CCC-SLP Leonidus Rowand 10/17/2016,1:32 PM

## 2016-10-17 NOTE — Plan of Care (Signed)
Problem: Bowel/Gastric: Goal: Will not experience complications related to bowel motility Pt will safely tolerate po diet of least restrictive consistency w/ no overt s/s of aspiration noted by Staff/pt/family x3 sessions.   Problem: SLP Dysphagia Goals Goal: Misc Dysphagia Goal Pt will safely tolerate po diet of least restrictive consistency w/ no overt s/s of aspiration noted by Staff/pt/family x3 sessions.

## 2016-10-17 NOTE — Care Management CC44 (Signed)
Condition Code 44 Documentation Completed  Patient Details  Name: Lianne CureBilly R Grima MRN: [id number] Date of Birth: 07/28/1931   Condition Code 44 given:   (Patient with dementia.  Message has been left for son to reivew. Awaiting return call. ) Patient signature on Condition Code 44 notice:    Documentation of 2 MD's agreement:    Code 44 added to claim:       Chapman FitchBOWEN, Walfred Bettendorf T, RN 10/17/2016, 11:13 AM

## 2016-10-17 NOTE — Progress Notes (Signed)
Patient ID: [name], male   DOB: March 18, 1932, 83 y.o.   MRN: [id number]  [facility] PROGRESS NOTE  Daniel Barr WUJ:[id number] DOB: 1931-05-17 DOA: 10/03/2016 PCP: Daniel Kaplan, MD  HPI/Subjective: Patient was confused this morning.  He did not want the nurse to scan his band this a.m. The last time in the hospital they lost his hearing aids and he has difficulty hearing. As per the nurse, he did not sleep last night.  Objective: Vitals:   10/17/16 0448 10/17/16 1157  BP: 134/67   Pulse: (!) 56 61  Resp: 20   Temp: 98.3 F (36.8 C)     Filed Weights   10/21/2016 1600 10/18/2016 2019 10/17/16 0448  Weight: 99.8 kg (220 lb) 97.1 kg (214 lb) 97.5 kg (214 lb 14.4 oz)    ROS: Review of Systems  Unable to perform ROS: Mental status change   Exam: Physical Exam  HENT:  Nose: No mucosal edema.  Mouth/Throat: No oropharyngeal exudate or posterior oropharyngeal edema.  Eyes: Conjunctivae, EOM and lids are normal. Pupils are equal, round, and reactive to light.  Neck: No JVD present. Carotid bruit is not present. No edema present. No thyroid mass and no thyromegaly present.  Cardiovascular: S1 normal and S2 normal.  Exam reveals no gallop.   No murmur heard. Pulses:      Dorsalis pedis pulses are 2+ on the right side, and 2+ on the left side.  Respiratory: No respiratory distress. He has decreased breath sounds in the right lower field and the left lower field. He has no wheezes. He has no rhonchi. He has no rales.  GI: Soft. Bowel sounds are normal. There is no tenderness.  Musculoskeletal:       Right ankle: He exhibits swelling.       Left ankle: He exhibits swelling.  Lymphadenopathy:    He has no cervical adenopathy.  Neurological: He is alert.  Patient move his arms on his own  Skin: Skin is warm. No rash noted. Nails show no clubbing.  Psychiatric: He has a normal mood and affect.      Data Reviewed: Basic Metabolic Panel:  Recent Labs Lab 10/10/16 1638  10/11/16 0314 10/13/16 0340 10/21/2016 1642 10/17/16 0400  NA 144 143 143 140 146*  K 3.6 3.1* 3.3* 4.3 3.6  CL 107 109 106 107 114*  CO2 27 27 28 26 28   GLUCOSE 89 106* 112* 116* 96  BUN 13 10 14  37* 28*  CREATININE 1.11 0.68 0.87 1.92* 1.28*  CALCIUM 8.6* 8.2* 8.8* 8.6* 8.6*   Liver Function Tests:  Recent Labs Lab 10/26/2016 1642 10/17/16 0400  AST 22 22  ALT 20 20  ALKPHOS 67 65  BILITOT 0.7 0.9  PROT 7.0 6.9  ALBUMIN 3.7 3.6    Recent Labs Lab 10/12/2016 1642  LIPASE 43   CBC:  Recent Labs Lab 10/10/16 1638 10/11/16 0314 10/13/16 0340 10/09/2016 1642 10/17/16 0400  WBC 18.0* 12.3* 14.8* 11.1* 10.6  NEUTROABS  --   --   --  8.0*  --   HGB 14.3 14.6 15.7 14.8 14.8  HCT 42.8 42.6 45.5 44.4 44.6  MCV 87.2 88.5 88.3 90.4 90.0  PLT 310 274 302 299 300   Cardiac Enzymes:  Recent Labs Lab 10/11/2016 1642  TROPONINI <0.03    CBG:  Recent Labs Lab 10/10/16 2306  GLUCAP 137*    Recent Results (from the past 240 hour(s))  Urine culture     Status:  None   Collection Time: 10/10/16  4:38 PM  Result Value Ref Range Status   Specimen Description URINE, RANDOM  Final   Special Requests NONE  Final   Culture   Final    NO GROWTH Performed at [facility], [address], [place], [place] [address]    Report Status 10/12/2016 FINAL  Final  MRSA PCR Screening     Status: None   Collection Time: 10/11/16  6:32 AM  Result Value Ref Range Status   MRSA by PCR NEGATIVE NEGATIVE Final    Comment:        The GeneXpert MRSA Assay (FDA approved for NASAL specimens only), is one component of a comprehensive MRSA colonization surveillance program. It is not intended to diagnose MRSA infection nor to guide or monitor treatment for MRSA infections.      Studies: Dg Chest 2 View  Result Date: 10/21/2016 CLINICAL DATA:  83 year old male with weakness. Recent chest CT appearance suspicious for acute infectious exacerbation superimposed on chronic lung  disease. EXAM: CHEST  2 VIEW COMPARISON:  Chest CT 10/10/2016 and earlier. FINDINGS: Semi upright AP and lateral views of the chest. Stable lung volumes. Calcified aortic atherosclerosis. Stable cardiomegaly and mediastinal contours. No pneumothorax. No pleural effusion. No consolidation. Chronic increased peripheral and widespread interstitial opacity appears stable since 2017. The patchy superimposed peribronchial opacity on the recent CT is subtle. No areas of worsening ventilation. No acute osseous abnormality identified. Negative visible bowel gas pattern. IMPRESSION: 1. Stable to regressed patchy right upper lung opacity superimposed on chronic lung disease since the chest CT 10/10/2016. 2. No new cardiopulmonary abnormality. 3.  Calcified aortic atherosclerosis. Electronically Signed   By: [name] M.D.   On: Oct 21, 2016 16:27    Scheduled Meds: . amLODipine  10 mg Oral Daily  . aspirin EC  81 mg Oral Daily  . cefUROXime  500 mg Oral BID WC  . docusate sodium  100 mg Oral BID  . doxycycline  100 mg Oral BID WC  . enoxaparin (LOVENOX) injection  40 mg Subcutaneous Q24H  . loratadine  10 mg Oral Daily  . pantoprazole  40 mg Oral Daily  . potassium chloride SA  40 mEq Oral Daily  . QUEtiapine  12.5 mg Oral QHS  . sotalol  80 mg Oral BID  . tamsulosin  0.8 mg Oral Daily   Continuous Infusions: . sodium chloride 40 mL/hr at 10/17/16 1141    Assessment/Plan:  1. Acute delirium. Likely underlying dementia. Trial of Seroquel at night. Haldol when necessary. Because of his severe hearing loss this likely is not helping the issue. 2. Acute kidney injury. Holding enalapril and torsemide. Decrease rate of IV fluid hydration. Creatinine improved from 1.92-1.28. His baseline creatinine is 0.87. 3. Right upper lobe pneumonia. Patient can continue Ceftin and doxycycline. 4. Chronic respiratory failure with COPD. Obtain pulse ox on room air with ambulation 5. Essential hypertension on  Norvasc 6. History of atrial fibrillation on sotalol. Aspirin only for anticoagulation 7. History of pulmonary nodules. Stable from last appearance 8. Weakness. Again physical therapy recommending rehabilitation. Last hospitalization the son refused rehabilitation and wanted to go back to his usual facility.  Code Status:     Code Status Orders        Start     Ordered   10-21-2016 1804  Do not attempt resuscitation (DNR)  Continuous    Question Answer Comment  In the event of cardiac or respiratory ARREST Do not  call a "code blue"   In the event of cardiac or respiratory ARREST Do not perform Intubation, CPR, defibrillation or ACLS   In the event of cardiac or respiratory ARREST Use medication by any route, position, wound care, and other measures to relive pain and suffering. May use oxygen, suction and manual treatment of airway obstruction as needed for comfort.      12-05-2016 1804    Code Status History    Date Active Date Inactive Code Status Order ID Comments User Context   10/10/2016 11:17 PM 10/13/2016  8:23 PM DNR [id number]  Tonye RoyaltyHugelmeyer, Alexis, DO Inpatient   10/10/2016 10:11 PM 10/10/2016 11:17 PM Full Code [id number]  Tonye RoyaltyHugelmeyer, Alexis, DO Inpatient   11/22/2015  3:05 AM 11/22/2015  8:03 PM DNR [id number]  Tonye RoyaltyHugelmeyer, Alexis, DO Inpatient   09/22/2015 10:22 PM 09/25/2015  4:46 PM DNR [id number]  Oralia ManisWillis, David, MD Inpatient    Advance Directive Documentation     Most Recent Value  Type of Advance Directive  Out of facility DNR (pink MOST or yellow form)  Pre-existing out of facility DNR order (yellow form or pink MOST form)  -  "MOST" Form in Place?  -     Family Communication: Spoke with son on the phone Disposition Plan: To rehabilitation versus back to [place] [facility]  Antibiotics:  Oral doxycycline and Ceftin  Time spent: 26 minutes  Daniel Barr, Daniel Barr  [employer]

## 2016-10-17 NOTE — Evaluation (Signed)
Physical Therapy Evaluation Patient Details Name: Daniel Barr MRN: [id number] DOB: 08-21-1931 Today's Date: 10/17/2016   History of Present Illness  Pt is an 83 y.o.malewith a known history of CHF, atrial fibrillation, COPD, chronic respiratory failure who was recently in the hospital for right-sided pneumonia and discharged to rehabilitation returns due to worsening weakness and fall. He has been found to have acute kidney injury likely due to decreased oral intake.    Clinical Impression  Pt presents with significant deficits in strength, transfers, mobility, gait, balance, and activity tolerance.  Pt required +2 Max A with sup to sit and +1 Mod A with sit to sup.  Pt required Mod A for sit to/from stand transfers at EOB and was unable to initiate gait.  Pt found with nasal canula removed with SpO2 at 92% on room air with HR of 61 bpm.  Nsg informed and gave OK for trial with PT on room air.  After bed mobility and sit to/from stand transfer pt's SpO2 increased to 95% with a HR of 64 bpm and a BP of 139/68 mmHg.  Nsg updated and requested pt to stay on room air. Pt will benefit from PT services upon discharge in a SNF setting secondary to the extent of the above deficits.       Follow Up Recommendations SNF    Equipment Recommendations  Rolling walker with 5" wheels (Pt may already own RW)    Recommendations for Other Services       Precautions / Restrictions Precautions Precautions: Fall Restrictions Weight Bearing Restrictions: No      Mobility  Bed Mobility Overal bed mobility: Needs Assistance Bed Mobility: Supine to Sit;Sit to Supine     Supine to sit: +2 for physical assistance;Max assist Sit to supine: Mod assist   General bed mobility comments: +2 Max A for upper body and BLEs during sup to sit but only +1 Mod A for sit to supine.  Transfers Overall transfer level: Needs assistance Equipment used: Rolling walker (2 wheeled) Transfers: Sit to/from Stand Sit to  Stand: Mod assist         General transfer comment: Mod tactile cues for sequencing   Ambulation/Gait             General Gait Details: Max verbal and tactile cues with pt in standing to attempt initiation of gait with pt unable and impulsively returned to sitting at EOB.  Stairs            Wheelchair Mobility    Modified Rankin (Stroke Patients Only)       Balance Overall balance assessment: Needs assistance Sitting-balance support: Feet supported;Bilateral upper extremity supported Sitting balance-Leahy Scale: Poor Sitting balance - Comments: Posterior lean in sitting initially that required mod A to prevent LOB, gradually improved to fair balance with SBA  Postural control: Posterior lean Standing balance support: Bilateral upper extremity supported Standing balance-Leahy Scale: Fair                               Pertinent Vitals/Pain Pain Assessment: No/denies pain    Home Living Family/patient expects to be discharged to:: Assisted living (Unable to obtain history from patient secondary to cognition, history from prior admission)               Home Equipment: Dan Humphreys - 2 wheels      Prior Function Level of Independence: Needs assistance   Gait / Transfers Assistance  Needed: RW with ind at ALF  ADL's / Homemaking Assistance Needed: lives in ALF for personal care and meals  Comments: pt is not able to confirm his PLOF but per chart was ind as reported by ALF with RW     Hand Dominance        Extremity/Trunk Assessment        Lower Extremity Assessment Lower Extremity Assessment: Generalized weakness       Communication   Communication: HOH  Cognition Arousal/Alertness: Lethargic Behavior During Therapy: Flat affect Overall Cognitive Status: No family/caregiver present to determine baseline cognitive functioning                                 General Comments: Very HOH      General Comments       Exercises Total Joint Exercises Ankle Circles/Pumps: AAROM;Both;10 reps Heel Slides: AAROM;Both;10 reps Hip ABduction/ADduction: AAROM;Both;10 reps Straight Leg Raises: AAROM;Both;10 reps Other Exercises Other Exercises: Seated core/balance therex with max verbal/tactile cues for upright position with assistance progressing from pt requiring mod A to eventually requiring SBA   Assessment/Plan    PT Assessment Patient needs continued PT services  PT Problem List Decreased strength;Decreased activity tolerance;Decreased balance;Decreased knowledge of use of DME;Decreased mobility       PT Treatment Interventions DME instruction;Gait training;Functional mobility training;Neuromuscular re-education;Balance training;Therapeutic exercise;Therapeutic activities;Patient/family education    PT Goals (Current goals can be found in the Care Plan section)  Acute Rehab PT Goals Patient Stated Goal: none stated PT Goal Formulation: Patient unable to participate in goal setting Time For Goal Achievement: 10/30/16 Potential to Achieve Goals: Fair    Frequency Min 2X/week   Barriers to discharge        Co-evaluation               AM-PAC PT "6 Clicks" Daily Activity  Outcome Measure Difficulty turning over in bed (including adjusting bedclothes, sheets and blankets)?: Total Difficulty moving from lying on back to sitting on the side of the bed? : Total Difficulty sitting down on and standing up from a chair with arms (e.g., wheelchair, bedside commode, etc,.)?: Total Help needed moving to and from a bed to chair (including a wheelchair)?: Total Help needed walking in hospital room?: Total Help needed climbing 3-5 steps with a railing? : Total 6 Click Score: 6    End of Session Equipment Utilized During Treatment: Gait belt Activity Tolerance: Patient limited by fatigue;Patient limited by lethargy Patient left: in bed;with bed alarm set;with call bell/phone within reach Nurse  Communication: Mobility status;Other (comment) (Pt found in room with canula removed, nsg gave ok for trial with PT on room air with results read back to nsg and [facility] given for pt to remain on room air at end of session.) PT Visit Diagnosis: Difficulty in walking, not elsewhere classified (R26.2);Muscle weakness (generalized) (M62.81)    Time: 1000-1029 PT Time Calculation (min) (ACUTE ONLY): 29 min   Charges:   PT Evaluation $PT Eval Moderate Complexity: 1 Procedure PT Treatments $Therapeutic Exercise: 8-22 mins   PT G Codes:   PT G-Codes **NOT FOR INPATIENT CLASS** Functional Assessment Tool Used: AM-PAC 6 Clicks Basic Mobility Functional Limitation: Mobility: Walking and moving around Mobility: Walking and Moving Around Current Status (Z6109): 100 percent impaired, limited or restricted Mobility: Walking and Moving Around Goal Status (U0454): At least 40 percent but less than 60 percent impaired, limited or restricted  Ovidio Hanger. Scott Adah Stoneberg PT, DPT 10/17/16, 12:18 PM

## 2016-10-17 NOTE — Care Management CC44 (Signed)
Condition Code 44 Documentation Completed  Patient Details  Name: Daniel Barr MRN: [id number] Date of Birth: 02/02/1932   Condition Code 44 given:  Yes Reviewed with son Patient signature on Condition Code 6444 notice:    Documentation of 2 MD's agreement:    Code 44 added to claim:       Chapman FitchBOWEN, Devery Murgia T, RN 10/17/2016, 1:48 PM

## 2016-10-17 NOTE — Progress Notes (Signed)
SNF and Non-Emergent EMS Transport Benefits:  Number called: [phone] Rep: Opal Sidles Reference Number: [id number]  [place] [employer] Complete HMO Plan One active as of 05/05/16 with no deductible.  Out of pocket max is $4400, of which $316.85 met so far.  In-network SNF: $0 copay for days 1-20, a $160 daily copay for days 21-48, and a $0 copay for days 49-100.  Once out of pocket is reached, patient covered at 100% for remainder of 100 day benefit period.  $0 copay for professional fees and 3 day hospital stay is not required.  Josem Kaufmann is required: [phone].    Non-emergent EMS transport: $250 copay for each one way medically necessary, Medicare covered trip.  Josem Kaufmann is not  required.

## 2016-10-17 NOTE — Care Management CC44 (Signed)
Condition Code 44 Documentation Completed  Patient Details  Name: Daniel Barr MRN: [id number] Date of Birth: 02/16/1932   Condition Code 44 given:   (Patient with dementia.  Message has been left for son to reivew. Awaiting return call. ) Patient signature on Condition Code 44 notice:    Documentation of 2 MD's agreement: yes   Code 44 added to claim:   yes    Berna BueCheryl Taran Haynesworth, RN 10/17/2016, 11:18 AM

## 2016-10-18 LAB — BASIC METABOLIC PANEL
ANION GAP: 8 (ref 5–15)
BUN: 12 mg/dL (ref 6–20)
CALCIUM: 8.8 mg/dL — AB (ref 8.9–10.3)
CO2: 28 mmol/L (ref 22–32)
CREATININE: 0.74 mg/dL (ref 0.61–1.24)
Chloride: 110 mmol/L (ref 101–111)
Glucose, Bld: 88 mg/dL (ref 65–99)
Potassium: 3.4 mmol/L — ABNORMAL LOW (ref 3.5–5.1)
Sodium: 146 mmol/L — ABNORMAL HIGH (ref 135–145)

## 2016-10-18 MED ORDER — QUETIAPINE FUMARATE 25 MG PO TABS
12.5000 mg | ORAL_TABLET | Freq: Every day | ORAL | Status: DC
  Administered 2016-10-18 – 2016-10-19 (×2): 12.5 mg via ORAL
  Filled 2016-10-18 (×2): qty 1

## 2016-10-18 MED ORDER — OXYCODONE-ACETAMINOPHEN 5-325 MG PO TABS: 1.0000 | ORAL_TABLET | Freq: Four times a day (QID) | ORAL | Status: DC | PRN

## 2016-10-18 NOTE — Care Management Obs Status (Signed)
MEDICARE OBSERVATION STATUS NOTIFICATION   Patient Details  Name: Daniel Barr MRN: [id number] Date of Birth: 02/24/1932   Medicare Observation Status Notification Given:  Yes (MOON Letter)    Morio Widen A, RN 10/18/2016, 2:49 PM

## 2016-10-18 NOTE — Plan of Care (Signed)
Problem: Education: Goal: Knowledge of [facility] Education information/materials will improve Outcome: Not Progressing Patient has dementia.  Problem: Safety: Goal: Ability to remain free from injury will improve Outcome: Progressing Safety sitter present to keep patient safe.  Problem: Health Behavior/Discharge Planning: Goal: Ability to manage health-related needs will improve Outcome: Not Progressing Patient has dementia and needs assistance.

## 2016-10-18 NOTE — Progress Notes (Signed)
Patient ID: Daniel Barr, male   DOB: Oct 08, 1931, 83 y.o.   MRN: [id number]  [employer] [facility] PROGRESS NOTE  SHRIYANS KUENZI WUJ:[id number] DOB: [birth year] DOA: 10/12/2016 PCP: Derwood Kaplan, MD  HPI/Subjective: conitnues to be confused   Objective: Vitals:   10/18/16 0628 10/18/16 1239  BP: (!) 167/75 130/82  Pulse:  70  Resp: (!) 22 (!) 22  Temp: 97.7 F (36.5 C) 97.7 F (36.5 C)    Filed Weights   10/15/2016 2019 10/17/16 0448 10/18/16 0500  Weight: 214 lb (97.1 kg) 214 lb 14.4 oz (97.5 kg) 207 lb 3.2 oz (94 kg)    ROS: Review of Systems  Unable to perform ROS: Mental status change   Exam: Physical Exam  HENT:  Nose: No mucosal edema.  Mouth/Throat: No oropharyngeal exudate or posterior oropharyngeal edema.  Eyes: Conjunctivae, EOM and lids are normal. Pupils are equal, round, and reactive to light.  Neck: No JVD present. Carotid bruit is not present. No edema present. No thyroid mass and no thyromegaly present.  Cardiovascular: S1 normal and S2 normal.  Exam reveals no gallop.   No murmur heard. Pulses:      Dorsalis pedis pulses are 2+ on the right side, and 2+ on the left side.  Respiratory: No respiratory distress. He has decreased breath sounds in the right lower field and the left lower field. He has no wheezes. He has no rhonchi. He has no rales.  GI: Soft. Bowel sounds are normal. There is no tenderness.  Musculoskeletal:       Right ankle: He exhibits swelling.       Left ankle: He exhibits swelling.  Lymphadenopathy:    He has no cervical adenopathy.  Neurological: He is alert.  Patient move his arms on his own  Skin: Skin is warm. No rash noted. Nails show no clubbing.  Psychiatric: He has a normal mood and affect.      Data Reviewed: Basic Metabolic Panel:  Recent Labs Lab 10/13/16 0340 10/30/2016 1642 10/17/16 0400 10/18/16 0430  NA 143 140 146* 146*  K 3.3* 4.3 3.6 3.4*  CL 106 107 114* 110  CO2 28 26 28 28   GLUCOSE 112* 116* 96 88  BUN  14 37* 28* 12  CREATININE 0.87 1.92* 1.28* 0.74  CALCIUM 8.8* 8.6* 8.6* 8.8*   Liver Function Tests:  Recent Labs Lab 10/29/2016 1642 10/17/16 0400  AST 22 22  ALT 20 20  ALKPHOS 67 65  BILITOT 0.7 0.9  PROT 7.0 6.9  ALBUMIN 3.7 3.6    Recent Labs Lab 10/17/2016 1642  LIPASE 43   CBC:  Recent Labs Lab 10/13/16 0340 10/29/2016 1642 10/17/16 0400  WBC 14.8* 11.1* 10.6  NEUTROABS  --  8.0*  --   HGB 15.7 14.8 14.8  HCT 45.5 44.4 44.6  MCV 88.3 90.4 90.0  PLT 302 299 300   Cardiac Enzymes:  Recent Labs Lab 10/10/2016 1642  TROPONINI <0.03    CBG: No results for input(s): GLUCAP in the last 168 hours.  Recent Results (from the past 240 hour(s))  Urine culture     Status: None   Collection Time: 10/10/16  4:38 PM  Result Value Ref Range Status   Specimen Description URINE, RANDOM  Final   Special Requests NONE  Final   Culture   Final    NO GROWTH Performed at [facility], [address] [place], [place] [address]    Report Status 10/12/2016 FINAL  Final  MRSA PCR Screening     Status: None   Collection Time: 10/11/16  6:32 AM  Result Value Ref Range Status   MRSA by PCR NEGATIVE NEGATIVE Final    Comment:        The GeneXpert MRSA Assay (FDA approved for NASAL specimens only), is one component of a comprehensive MRSA colonization surveillance program. It is not intended to diagnose MRSA infection nor to guide or monitor treatment for MRSA infections.      Studies: Dg Chest 2 View  Result Date: 10/08/2016 CLINICAL DATA:  83 year old male with weakness. Recent chest CT appearance suspicious for acute infectious exacerbation superimposed on chronic lung disease. EXAM: CHEST  2 VIEW COMPARISON:  Chest CT 10/10/2016 and earlier. FINDINGS: Semi upright AP and lateral views of the chest. Stable lung volumes. Calcified aortic atherosclerosis. Stable cardiomegaly and mediastinal contours. No pneumothorax. No pleural effusion. No consolidation. Chronic  increased peripheral and widespread interstitial opacity appears stable since 2017. The patchy superimposed peribronchial opacity on the recent CT is subtle. No areas of worsening ventilation. No acute osseous abnormality identified. Negative visible bowel gas pattern. IMPRESSION: 1. Stable to regressed patchy right upper lung opacity superimposed on chronic lung disease since the chest CT 10/10/2016. 2. No new cardiopulmonary abnormality. 3.  Calcified aortic atherosclerosis. Electronically Signed   By: [name] M.D.   On: 10/13/2016 16:27    Scheduled Meds: . amLODipine  10 mg Oral Daily  . aspirin EC  81 mg Oral Daily  . cefUROXime  500 mg Oral BID WC  . docusate sodium  100 mg Oral BID  . doxycycline  100 mg Oral BID WC  . enoxaparin (LOVENOX) injection  40 mg Subcutaneous Q24H  . loratadine  10 mg Oral Daily  . pantoprazole  40 mg Oral Daily  . potassium chloride SA  40 mEq Oral Daily  . QUEtiapine  12.5 mg Oral QHS  . QUEtiapine  12.5 mg Oral Daily  . sotalol  80 mg Oral BID  . tamsulosin  0.8 mg Oral Daily   Continuous Infusions:   Assessment/Plan:  1. Acute delirium. Likely underlying dementia. I will add Seroquel in the morning continue nighttime Seroquel  2. Acute kidney injury. Resolved with IV hydration and holding his diuretics 3. Right upper lobe pneumonia. Patient can continue Ceftin and doxycycline. 4. Chronic respiratory failure with COPD. Obtain pulse ox on room air with ambulation 5. Essential hypertension on Norvasc 6. History of atrial fibrillation on sotalol. Aspirin only for anticoagulation 7. History of pulmonary nodules. Stable from last appearance 8. Weakness. Again physical therapy recommending rehabilitation. Last hospitalization the son refused rehabilitation and wanted to go back to his usual facility.  Code Status:     Code Status Orders        Start     Ordered   10/30/2016 1804  Do not attempt resuscitation (DNR)  Continuous    Question Answer  Comment  In the event of cardiac or respiratory ARREST Do not call a "code blue"   In the event of cardiac or respiratory ARREST Do not perform Intubation, CPR, defibrillation or ACLS   In the event of cardiac or respiratory ARREST Use medication by any route, position, wound care, and other measures to relive pain and suffering. May use oxygen, suction and manual treatment of airway obstruction as needed for comfort.      10/27/2016 1804    Code Status History    Date Active Date Inactive Code Status Order ID  Comments User Context   10/10/2016 11:17 PM 10/13/2016  8:23 PM DNR [id number]  Tonye RoyaltyHugelmeyer, Alexis, DO Inpatient   10/10/2016 10:11 PM 10/10/2016 11:17 PM Full Code [id number]  Tonye RoyaltyHugelmeyer, Alexis, DO Inpatient   11/22/2015  3:05 AM 11/22/2015  8:03 PM DNR [id number]  Tonye RoyaltyHugelmeyer, Alexis, DO Inpatient   09/22/2015 10:22 PM 09/25/2015  4:46 PM DNR [id number]  Oralia ManisWillis, David, MD Inpatient    Advance Directive Documentation     Most Recent Value  Type of Advance Directive  Out of facility DNR (pink MOST or yellow form)  Pre-existing out of facility DNR order (yellow form or pink MOST form)  -  "MOST" Form in Place?  -     Family Communication: prog poor Disposition Plan: To rehabilitation versus back to [facility]  Antibiotics:  Oral doxycycline and Ceftin  Time spent: 26 minutes  Ison Wichmann, [facility]  [employer] [facility]

## 2016-10-19 ENCOUNTER — Observation Stay: Payer: Medicare Other

## 2016-10-19 NOTE — Progress Notes (Signed)
Patient ID: Daniel Barr, male   DOB: [birth year], 83 y.o.   MRN: [id number]  [employer] PROGRESS NOTE  Daniel Barr MVH:[id number] DOB: 06-06-1931 DOA: 2016-10-21 PCP: Derwood Kaplan, MD  HPI/Subjective: Confusion improved At Bedside, patient had a episode last night where he required suctioning from his lung   Objective: Vitals:   10/19/16 0431 10/19/16 1216  BP: (!) 166/84 (!) 152/73  Pulse: 64 88  Resp: (!) 24 (!) 24  Temp: 97.5 F (36.4 C) 97.9 F (36.6 C)    Filed Weights   10/18/16 0500 10/19/16 0335 10/19/16 0401  Weight: 207 lb 3.2 oz (94 kg) 223 lb 14.4 oz (101.6 kg) 208 lb 6.4 oz (94.5 kg)    ROS: Review of Systems  Unable to perform ROS: Mental status change   Exam: Physical Exam  HENT:  Nose: No mucosal edema.  Mouth/Throat: No oropharyngeal exudate or posterior oropharyngeal edema.  Eyes: Conjunctivae, EOM and lids are normal. Pupils are equal, round, and reactive to light.  Neck: No JVD present. Carotid bruit is not present. No edema present. No thyroid mass and no thyromegaly present.  Cardiovascular: S1 normal and S2 normal.  Exam reveals no gallop.   No murmur heard. Pulses:      Dorsalis pedis pulses are 2+ on the right side, and 2+ on the left side.  Respiratory: No respiratory distress. He has decreased breath sounds in the right lower field and the left lower field. He has no wheezes. He has no rhonchi. He has no rales.  GI: Soft. Bowel sounds are normal. There is no tenderness.  Musculoskeletal:       Right ankle: He exhibits swelling.       Left ankle: He exhibits swelling.  Lymphadenopathy:    He has no cervical adenopathy.  Neurological: He is alert.  Patient move his arms on his own  Skin: Skin is warm. No rash noted. Nails show no clubbing.  Psychiatric: He has a normal mood and affect.      Data Reviewed: Basic Metabolic Panel:  Recent Labs Lab 10/13/16 0340 Oct 21, 2016 1642 10/17/16 0400 10/18/16 0430  NA 143 140 146* 146*   K 3.3* 4.3 3.6 3.4*  CL 106 107 114* 110  CO2 28 26 28 28   GLUCOSE 112* 116* 96 88  BUN 14 37* 28* 12  CREATININE 0.87 1.92* 1.28* 0.74  CALCIUM 8.8* 8.6* 8.6* 8.8*   Liver Function Tests:  Recent Labs Lab October 21, 2016 1642 10/17/16 0400  AST 22 22  ALT 20 20  ALKPHOS 67 65  BILITOT 0.7 0.9  PROT 7.0 6.9  ALBUMIN 3.7 3.6    Recent Labs Lab 10/21/2016 1642  LIPASE 43   CBC:  Recent Labs Lab 10/13/16 0340 Oct 21, 2016 1642 10/17/16 0400  WBC 14.8* 11.1* 10.6  NEUTROABS  --  8.0*  --   HGB 15.7 14.8 14.8  HCT 45.5 44.4 44.6  MCV 88.3 90.4 90.0  PLT 302 299 300   Cardiac Enzymes:  Recent Labs Lab 21-Oct-2016 1642  TROPONINI <0.03    CBG: No results for input(s): GLUCAP in the last 168 hours.  Recent Results (from the past 240 hour(s))  Urine culture     Status: None   Collection Time: 10/10/16  4:38 PM  Result Value Ref Range Status   Specimen Description URINE, RANDOM  Final   Special Requests NONE  Final   Culture   Final    NO GROWTH Performed at [facility],  [address] [place], [place] [address]    Report Status 10/12/2016 FINAL  Final  MRSA PCR Screening     Status: None   Collection Time: 10/11/16  6:32 AM  Result Value Ref Range Status   MRSA by PCR NEGATIVE NEGATIVE Final    Comment:        The GeneXpert MRSA Assay (FDA approved for NASAL specimens only), is one component of a comprehensive MRSA colonization surveillance program. It is not intended to diagnose MRSA infection nor to guide or monitor treatment for MRSA infections.      Studies: No results found.  Scheduled Meds: . amLODipine  10 mg Oral Daily  . aspirin EC  81 mg Oral Daily  . cefUROXime  500 mg Oral BID WC  . docusate sodium  100 mg Oral BID  . doxycycline  100 mg Oral BID WC  . enoxaparin (LOVENOX) injection  40 mg Subcutaneous Q24H  . loratadine  10 mg Oral Daily  . pantoprazole  40 mg Oral Daily  . potassium chloride SA  40 mEq Oral Daily  .  QUEtiapine  12.5 mg Oral QHS  . QUEtiapine  12.5 mg Oral Daily  . sotalol  80 mg Oral BID  . tamsulosin  0.8 mg Oral Daily   Continuous Infusions:   Assessment/Plan:  1. Acute delirium. Likely underlying dementia. Improved continue serum  2. Acute kidney injury. Resolved with IV hydration and holding his diuretics 3. Right upper lobe pneumonia. Due to another episode will obtain x-ray continue oral antibiotics speech evaluation again 4. Chronic respiratory failure with COPD. continue oxygen on disc 5. Essential hypertension on Norvasc 6. History of atrial fibrillation on sotalol. Aspirin only for anticoagulation 7. History of pulmonary nodules. Stable from last appearance 8. Weakness. Suspect can be discharged to rehabilitation tomorrow  Code Status:     Code Status Orders        Start     Ordered   10/15/2016 1804  Do not attempt resuscitation (DNR)  Continuous    Question Answer Comment  In the event of cardiac or respiratory ARREST Do not call a "code blue"   In the event of cardiac or respiratory ARREST Do not perform Intubation, CPR, defibrillation or ACLS   In the event of cardiac or respiratory ARREST Use medication by any route, position, wound care, and other measures to relive pain and suffering. May use oxygen, suction and manual treatment of airway obstruction as needed for comfort.      10/08/2016 1804    Code Status History    Date Active Date Inactive Code Status Order ID Comments User Context   10/10/2016 11:17 PM 10/13/2016  8:23 PM DNR [id number]  Tonye RoyaltyHugelmeyer, Alexis, DO Inpatient   10/10/2016 10:11 PM 10/10/2016 11:17 PM Full Code [id number]  Tonye RoyaltyHugelmeyer, Alexis, DO Inpatient   11/22/2015  3:05 AM 11/22/2015  8:03 PM DNR [id number]  Tonye RoyaltyHugelmeyer, Alexis, DO Inpatient   09/22/2015 10:22 PM 09/25/2015  4:46 PM DNR [id number]  Oralia ManisWillis, David, MD Inpatient    Advance Directive Documentation     Most Recent Value  Type of Advance Directive  Out of facility DNR (pink MOST or yellow  form)  Pre-existing out of facility DNR order (yellow form or pink MOST form)  -  "MOST" Form in Place?  -     Family Communication: prog poor Disposition Plan: To rehabilitation versus back to [facility]  Antibiotics:  Oral doxycycline and Ceftin  Time spent: 26 minutes  Donnetta Gillin,  [facility]  [employer]

## 2016-10-19 NOTE — Progress Notes (Signed)
Patient has required suctioning with yankar throughout shift. Has sputum that he is unable to cough up. Encouraged to sit with head elevated to help with drainage. Loel RoVerne H. Mee Macdonnell, RN 10/19/16 1726

## 2016-10-20 ENCOUNTER — Encounter: Payer: Self-pay | Admitting: Internal Medicine

## 2016-10-20 ENCOUNTER — Observation Stay: Payer: Medicare Other

## 2016-10-20 DIAGNOSIS — J189 Pneumonia, unspecified organism: Secondary | ICD-10-CM | POA: Diagnosis present

## 2016-10-20 DIAGNOSIS — R400 Somnolence: Secondary | ICD-10-CM

## 2016-10-20 DIAGNOSIS — R911 Solitary pulmonary nodule: Secondary | ICD-10-CM | POA: Diagnosis present

## 2016-10-20 DIAGNOSIS — R4182 Altered mental status, unspecified: Secondary | ICD-10-CM | POA: Diagnosis present

## 2016-10-20 DIAGNOSIS — R918 Other nonspecific abnormal finding of lung field: Secondary | ICD-10-CM | POA: Diagnosis present

## 2016-10-20 DIAGNOSIS — I4891 Unspecified atrial fibrillation: Secondary | ICD-10-CM | POA: Diagnosis present

## 2016-10-20 DIAGNOSIS — Z515 Encounter for palliative care: Secondary | ICD-10-CM | POA: Diagnosis present

## 2016-10-20 DIAGNOSIS — Z79899 Other long term (current) drug therapy: Secondary | ICD-10-CM | POA: Diagnosis not present

## 2016-10-20 DIAGNOSIS — Z7982 Long term (current) use of aspirin: Secondary | ICD-10-CM | POA: Diagnosis not present

## 2016-10-20 DIAGNOSIS — R531 Weakness: Secondary | ICD-10-CM | POA: Diagnosis present

## 2016-10-20 DIAGNOSIS — N179 Acute kidney failure, unspecified: Secondary | ICD-10-CM | POA: Diagnosis present

## 2016-10-20 DIAGNOSIS — Z87891 Personal history of nicotine dependence: Secondary | ICD-10-CM | POA: Diagnosis not present

## 2016-10-20 DIAGNOSIS — H9190 Unspecified hearing loss, unspecified ear: Secondary | ICD-10-CM | POA: Diagnosis present

## 2016-10-20 DIAGNOSIS — G4710 Hypersomnia, unspecified: Secondary | ICD-10-CM | POA: Diagnosis present

## 2016-10-20 DIAGNOSIS — E860 Dehydration: Secondary | ICD-10-CM | POA: Diagnosis present

## 2016-10-20 DIAGNOSIS — K219 Gastro-esophageal reflux disease without esophagitis: Secondary | ICD-10-CM | POA: Diagnosis present

## 2016-10-20 DIAGNOSIS — Z791 Long term (current) use of non-steroidal anti-inflammatories (NSAID): Secondary | ICD-10-CM | POA: Diagnosis not present

## 2016-10-20 DIAGNOSIS — Z66 Do not resuscitate: Secondary | ICD-10-CM | POA: Diagnosis present

## 2016-10-20 DIAGNOSIS — J9602 Acute respiratory failure with hypercapnia: Secondary | ICD-10-CM

## 2016-10-20 DIAGNOSIS — J9621 Acute and chronic respiratory failure with hypoxia: Secondary | ICD-10-CM | POA: Diagnosis present

## 2016-10-20 DIAGNOSIS — I110 Hypertensive heart disease with heart failure: Secondary | ICD-10-CM | POA: Diagnosis present

## 2016-10-20 DIAGNOSIS — R627 Adult failure to thrive: Secondary | ICD-10-CM | POA: Diagnosis present

## 2016-10-20 DIAGNOSIS — Z8701 Personal history of pneumonia (recurrent): Secondary | ICD-10-CM | POA: Diagnosis not present

## 2016-10-20 DIAGNOSIS — J440 Chronic obstructive pulmonary disease with acute lower respiratory infection: Secondary | ICD-10-CM | POA: Diagnosis present

## 2016-10-20 DIAGNOSIS — E785 Hyperlipidemia, unspecified: Secondary | ICD-10-CM | POA: Diagnosis present

## 2016-10-20 DIAGNOSIS — F0390 Unspecified dementia without behavioral disturbance: Secondary | ICD-10-CM | POA: Diagnosis present

## 2016-10-20 DIAGNOSIS — Z9981 Dependence on supplemental oxygen: Secondary | ICD-10-CM | POA: Diagnosis not present

## 2016-10-20 DIAGNOSIS — I5032 Chronic diastolic (congestive) heart failure: Secondary | ICD-10-CM | POA: Diagnosis present

## 2016-10-20 LAB — BLOOD GAS, ARTERIAL
Acid-base deficit: 0.2 mmol/L (ref 0.0–2.0)
Bicarbonate: 30.2 mmol/L — ABNORMAL HIGH (ref 20.0–28.0)
FIO2: 0.4
pCO2 arterial: 72 mmHg (ref 32.0–48.0)
pH, Arterial: 7.23 — ABNORMAL LOW (ref 7.350–7.450)
pO2, Arterial: 33 mmHg — CL (ref 83.0–108.0)

## 2016-10-20 LAB — BASIC METABOLIC PANEL
ANION GAP: 8 (ref 5–15)
BUN: 20 mg/dL (ref 6–20)
CHLORIDE: 110 mmol/L (ref 101–111)
CO2: 27 mmol/L (ref 22–32)
Calcium: 9.2 mg/dL (ref 8.9–10.3)
Creatinine, Ser: 0.89 mg/dL (ref 0.61–1.24)
GFR calc Af Amer: >60 mL/min (ref >60)
Glucose, Bld: 130 mg/dL — ABNORMAL HIGH (ref 65–99)
POTASSIUM: 3.7 mmol/L (ref 3.5–5.1)
SODIUM: 145 mmol/L (ref 135–145)

## 2016-10-20 LAB — GLUCOSE, CAPILLARY
GLUCOSE-CAPILLARY: 163 mg/dL — AB (ref 65–99)
GLUCOSE-CAPILLARY: 149 mg/dL — AB (ref 65–99)

## 2016-10-20 MED ORDER — HYDRALAZINE HCL 20 MG/ML IJ SOLN: 10.0000 mg | INTRAMUSCULAR | Status: DC | PRN

## 2016-10-20 MED ORDER — DOXYCYCLINE HYCLATE 100 MG IV SOLR
100.0000 mg | Freq: Two times a day (BID) | INTRAVENOUS | Status: DC
  Administered 2016-10-20 (×2): 100 mg via INTRAVENOUS
  Filled 2016-10-20 (×4): qty 100

## 2016-10-20 MED ORDER — ATROPINE SULFATE 1 MG/ML IJ SOLN
0.4000 mg | Freq: Once | INTRAMUSCULAR | Status: DC
  Filled 2016-10-20: qty 0.4

## 2016-10-20 MED ORDER — METOPROLOL TARTRATE 5 MG/5ML IV SOLN: 2.5000 mg | INTRAVENOUS | Status: DC | PRN

## 2016-10-20 NOTE — Progress Notes (Signed)
PT Cancellation Note  Patient Details Name: Daniel Barr MRN: [id number] DOB: 01/30/1932   Cancelled Treatment:    Reason Eval/Treat Not Completed: Medical issues which prohibited therapy; pt transferred to CCU d/t declined status including poorly responsive w/ need for CPAP, increased/decreased HR; will complete PT orders at this time and will re-evaluate pt as appropriate upon receipt of new PT orders.   Thomes Dinningavid Scott Daniel Barr 10/20/2016, 10:58 AM

## 2016-10-20 NOTE — Consult Note (Signed)
PULMONARY/CCM NOTE  Requesting MD/Service: Auburn Bilberry, MD Date of initial consultation: 06/18 Reason for consultation: Somnolence, suspected hypercarbia  PT PROFILE: 83 y.o. male recently hospitalized for pneumonia. Readmitted 2016/11/11 by the hospitalist service after suffering a fall. Admission diagnoses included AKI, pneumonia, diastolic heart failure. Patient is DNR. He was transferred to ICU/SDU with increasing somnolence and unresponsiveness on the morning of this consultation. He was placed on BiPAP with the suspicion for hypercarbic respiratory failure.   HPI:  Level V caveat. As above.  Past Medical History:  Diagnosis Date  . A-fib (HCC)   . CHF (congestive heart failure) (HCC)   . GERD (gastroesophageal reflux disease)   . Hard of hearing   . HLD (hyperlipidemia)   . Hypertension   . Nerve pain     Past Surgical History:  Procedure Laterality Date  . COLONOSCOPY    . JOINT REPLACEMENT      MEDICATIONS: I have reviewed all medications and confirmed regimen as documented  Social History   Social History  . Marital status: Single    Spouse name: N/A  . Number of children: N/A  . Years of education: N/A   Occupational History  . Not on file.   Social History Main Topics  . Smoking status: Former Games developer  . Smokeless tobacco: Never Used  . Alcohol use Yes  . Drug use: No  . Sexual activity: Not on file   Other Topics Concern  . Not on file   Social History Narrative  . No narrative on file    Family History  Problem Relation Age of Onset  . Stroke Mother   . Hypertension Unknown   . Stroke Unknown   . Cancer Unknown     ROS: No fever, myalgias/arthralgias, unexplained weight loss or weight gain No new focal weakness or sensory deficits No otalgia, hearing loss, visual changes, nasal and sinus symptoms, mouth and throat problems No neck pain or adenopathy No abdominal pain, N/V/D, diarrhea, change in bowel pattern No dysuria, change in  urinary pattern   Vitals:   10/20/16 1200 10/20/16 1300 10/20/16 1400 10/20/16 1500  BP: (!) 130/95 (!) 143/68 137/73 109/89  Pulse: 71 71 62 69  Resp: 20 (!) 23 20 (!) 21  Temp:   98 F (36.7 C)   TempSrc:   Axillary   SpO2: 99% 98% 96% 92%  Weight:      Height:         EXAM:  Gen: Minimally responsive, BiPAP mask in place, no overt distress HEENT: NCAT, sclera white Neck: Supple without LAN, thyromegaly, JVD Lungs: breath sounds diminished without adventitious sounds Cardiovascular: Reg, no murmurs noted Abdomen: Soft, nontender, normal BS Ext: without clubbing, cyanosis, edema Neuro: CNs grossly intact, motor and sensory intact Skin: Limited exam, no lesions noted  DATA:   BMP Latest Ref Rng & Units 10/20/2016 10/18/2016 10/17/2016  Glucose 65 - 99 mg/dL 409(W) 88 96  BUN 6 - 20 mg/dL 20 12 11(B)  Creatinine 0.61 - 1.24 mg/dL 1.47 8.29 5.62(Z)  Sodium 135 - 145 mmol/L 145 146(H) 146(H)  Potassium 3.5 - 5.1 mmol/L 3.7 3.4(L) 3.6  Chloride 101 - 111 mmol/L 110 110 114(H)  CO2 22 - 32 mmol/L 27 28 28   Calcium 8.9 - 10.3 mg/dL 9.2 3.0(Q) 6.5(H)    CBC Latest Ref Rng & Units 10/17/2016 11-11-16 10/13/2016  WBC 3.8 - 10.6 K/uL 10.6 11.1(H) 14.8(H)  Hemoglobin 13.0 - 18.0 g/dL 84.6 96.2 95.2  Hematocrit 40.0 - 52.0 %  44.6 44.4 45.5  Platelets 150 - 440 K/uL 300 299 302    CXR:  Pulmonary arteries appear enlarged. Otherwise no acute findings.  IMPRESSION:   Hypersomnolence - likely due to hypercarbia. Appears to be improving after several hours on BiPAP. AKI - resolved Recent pneumonia - I see no evidence of acute infiltrate on chest x-ray DNR -- I discussed this further with the patient's son and brother who confirm DNR status and understand the rationale for making such a decision.   PLAN:  The current care plan has been reviewed and modified by me. I did not make many changes. We will change the BiPAP to as needed. Avoid all sedating medications if  possible.   Trae Fischeravid Simonds, MD PCCM service Mobile [phone] Pager [phone] 10/20/2016 3:54 PM

## 2016-10-20 NOTE — Progress Notes (Signed)
Patient ID: Daniel Barr, male   DOB: [birth year], 83 y.o.   MRN: [id number]  [facility] PROGRESS NOTE  Daniel Barr:[id number] DOB: 1931-07-29 DOA: 2016/10/26 PCP: Daniel Kaplan, MD  HPI/Subjective: Daniel Barr: Patient had a episode of unresponsiveness. He is currently on BiPAP. Unresponsive son at bedside     Objective: Vitals:   10/20/16 0512 10/20/16 0654  BP:  131/76  Pulse: 81 63  Resp:    Temp: 98.3 F (36.8 C) 98 F (36.7 C)    Filed Weights   10/19/16 0335 10/19/16 0401 10/20/16 0500  Weight: 223 lb 14.4 oz (101.6 kg) 208 lb 6.4 oz (94.5 kg) 210 lb (95.3 kg)    ROS: Review of Systems  Unable to perform ROS: Mental status change   Exam: Physical Exam  HENT:  Nose: No mucosal edema.  Mouth/Throat: No oropharyngeal exudate or posterior oropharyngeal edema.  Eyes: Conjunctivae, EOM and lids are normal. Pupils are equal, round, and reactive to light.  Neck: No JVD present. Carotid bruit is not present. No edema present. No thyroid mass and no thyromegaly present.  Cardiovascular: S1 normal and S2 normal.  Exam reveals no gallop.   No murmur heard. Pulses:      Dorsalis pedis pulses are 2+ on the right side, and 2+ on the left side.  Respiratory: No respiratory distress. He has decreased breath sounds in the right lower field and the left lower field. He has no wheezes. He has no rhonchi. He has no rales.  GI: Soft. Bowel sounds are normal. There is no tenderness.  Musculoskeletal:       Right ankle: He exhibits swelling.       Left ankle: He exhibits swelling.  Lymphadenopathy:    He has no cervical adenopathy.  Neurological:  Unresponsive  Skin: Skin is warm. No rash noted. Nails show no clubbing.  Psychiatric: He has a normal mood and affect.      Data Reviewed: Basic Metabolic Panel:  Recent Labs Lab 26-Oct-2016 1642 10/17/16 0400 10/18/16 0430 10/20/16 0438  NA 140 146* 146* 145  K 4.3 3.6 3.4* 3.7  CL 107 114* 110 110  CO2 26 28 28  27   GLUCOSE 116* 96 88 130*  BUN 37* 28* 12 20  CREATININE 1.92* 1.28* 0.74 0.89  CALCIUM 8.6* 8.6* 8.8* 9.2   Liver Function Tests:  Recent Labs Lab 2016/10/26 1642 10/17/16 0400  AST 22 22  ALT 20 20  ALKPHOS 67 65  BILITOT 0.7 0.9  PROT 7.0 6.9  ALBUMIN 3.7 3.6    Recent Labs Lab 2016-10-26 1642  LIPASE 43   CBC:  Recent Labs Lab 26-Oct-2016 1642 10/17/16 0400  WBC 11.1* 10.6  NEUTROABS 8.0*  --   HGB 14.8 14.8  HCT 44.4 44.6  MCV 90.4 90.0  PLT 299 300   Cardiac Enzymes:  Recent Labs Lab October 26, 2016 1642  TROPONINI <0.03    CBG:  Recent Labs Lab 10/20/16 0654  GLUCAP 163*    Recent Results (from the past 240 hour(s))  Urine culture     Status: None   Collection Time: 10/10/16  4:38 PM  Result Value Ref Range Status   Specimen Description URINE, RANDOM  Final   Special Requests NONE  Final   Culture   Final    NO GROWTH Performed at [facility], [address] [place], [place] [address]    Report Status 10/12/2016 FINAL  Final  MRSA PCR Screening  Status: None   Collection Time: 10/11/16  6:32 AM  Result Value Ref Range Status   MRSA by PCR NEGATIVE NEGATIVE Final    Comment:        The GeneXpert MRSA Assay (FDA approved for NASAL specimens only), is one component of a comprehensive MRSA colonization surveillance program. It is not intended to diagnose MRSA infection nor to guide or monitor treatment for MRSA infections.      Studies: Dg Chest Port 1 View  Result Date: 10/20/2016 CLINICAL DATA:  Shortness of breath, atrial fibrillation, CHF, former smoker. EXAM: PORTABLE CHEST 1 VIEW COMPARISON:  Portable chest x-ray of October 19, 2016 FINDINGS: The lungs are adequately inflated. There is no focal infiltrate. The interstitial markings are coarse though stable. The heart is normal in size. The pulmonary vascularity is not engorged. There is calcification in the wall of the aortic arch. The bony thorax is unremarkable. IMPRESSION:  Mild chronic bronchitic changes.  No acute pneumonia nor CHF. Thoracic aortic atherosclerosis. Electronically Signed   By: Daniel  Barr M.D.   On: 10/20/2016 07:26   Dg Chest Port 1 View  Result Date: 10/19/2016 CLINICAL DATA:  SOB per physician order. Pt was admitted Oct 27, 2016 due to weakness after falling to knees while walking. Pt is poor historian as he has dementia. Pt was discharged 6 days ago for PNA. Hx - A-fib, CHF, HTN, former smoker. EXAM: PORTABLE CHEST 1 VIEW COMPARISON:  2016/10/27 FINDINGS: Heart size is upper normal. Aorta is calcified and tortuous. There are no focal consolidations or pleural effusions. No pulmonary edema. IMPRESSION: No evidence for acute cardiopulmonary abnormality. Aortic atherosclerosis. Electronically Signed   By: Daniel Barr M.D.   On: 10/19/2016 14:36    Scheduled Meds: . amLODipine  10 mg Oral Daily  . aspirin EC  81 mg Oral Daily  . atropine  0.4 mg Intravenous Once  . cefUROXime  500 mg Oral BID WC  . docusate sodium  100 mg Oral BID  . doxycycline  100 mg Oral BID WC  . enoxaparin (LOVENOX) injection  40 mg Subcutaneous Q24H  . loratadine  10 mg Oral Daily  . pantoprazole  40 mg Oral Daily  . potassium chloride SA  40 mEq Oral Daily  . sotalol  80 mg Oral BID  . tamsulosin  0.8 mg Oral Daily   Continuous Infusions:   Assessment/Plan:  1. Acute hypoxic respiratory failure chest x-ray reviewed from yesterday shows no significant abnormality suspect due to patient's progressive decline, recent CT scan without any pulmonary embolism, we will move the patient to the ICU continue BiPAP for now. I have updated the son. Plan is for BiPAP trial to see if improves if not patient will be made comfort care Acute kidney injury. Resolved with IV hydration and holding his diuretics 2. Right upper lobe pneumonia. Speech eval wants patient to wake 3. Chronic respiratory failure with COPD. continue oxygen on discharge  4. Essential hypertension on Norvasc  if able to take orally 5. History of atrial fibrillation on sotalol. Aspirin only for anticoagulation 6. History of pulmonary nodules. Stable 7. Weakness. Failure to thrive we will reevaluate in 24 hours  Code Status:     Code Status Orders        Start     Ordered   10-27-16 1804  Do not attempt resuscitation (DNR)  Continuous    Question Answer Comment  In the event of cardiac or respiratory ARREST Do not call a "code blue"  In the event of cardiac or respiratory ARREST Do not perform Intubation, CPR, defibrillation or ACLS   In the event of cardiac or respiratory ARREST Use medication by any route, position, wound care, and other measures to relive pain and suffering. May use oxygen, suction and manual treatment of airway obstruction as needed for comfort.      10/09/2016 1804    Code Status History    Date Active Date Inactive Code Status Order ID Comments User Context   10/10/2016 11:17 PM 10/13/2016  8:23 PM DNR [id number]  Tonye RoyaltyHugelmeyer, Alexis, DO Inpatient   10/10/2016 10:11 PM 10/10/2016 11:17 PM Full Code [id number]  Tonye RoyaltyHugelmeyer, Alexis, DO Inpatient   11/22/2015  3:05 AM 11/22/2015  8:03 PM DNR [id number]  Tonye RoyaltyHugelmeyer, Alexis, DO Inpatient   09/22/2015 10:22 PM 09/25/2015  4:46 PM DNR [id number]  Oralia ManisWillis, David, MD Inpatient    Advance Directive Documentation     Most Recent Value  Type of Advance Directive  Out of facility DNR (pink MOST or yellow form)  Pre-existing out of facility DNR order (yellow form or pink MOST form)  -  "MOST" Form in Place?  -     Family Communication: Son  Disposition Plan: To be determined if patient's condition  Antibiotics:  Oral doxycycline and Ceftin  Time spent: 35 minutes of critical care time spent  Konnar Ben, [facility]  [facility]

## 2016-10-20 NOTE — Progress Notes (Signed)
Pt noted to be diaphoretic and non-responsive this AM. RR called. VS checked. MD notified and came to bedside. BG - 163, Temp WDL, BP WDL. O2 sats and HR noted to increase and decrease frequently with HR dropping to low 40s. MD at bedside ordered 1gm atropine. Atropine given, medication effective. ABG's ordered along with CPAP continuous. MD called and gave updates to son Onalee Hua(david) and he is okay with patient being placed on BIPAP if needed. Will continue to monitor.

## 2016-10-20 NOTE — Progress Notes (Signed)
Patient opening eyes and more alert, took patient off of bipap and placed on 3lnc. Sats maintaining in the low 90's. Will continue to monitor.

## 2016-10-20 NOTE — Progress Notes (Signed)
SLP Cancellation Note  Patient Details Name: Daniel Barr MRN: [id number] DOB: 10/07/1931   Cancelled treatment:       Reason Eval/Treat Not Completed: Medical issues which prohibited therapy;Patient's level of consciousness (chart reviewed; NSG consulted re: pt's status)  NSG reported pt was being transferred to CCU d/t declined status including poorly responsive w/ need for CPAP, increased/decreased HR. Noted CXRs last 2 days w/ no focal infiltrate noted by radiologist.  ST services will f/u w/ reassessment of swallowing per MD order request when pt's status is appropriate.    Jerilynn SomKatherine Watson, MS, CCC-SLP Watson,Katherine 10/20/2016, 8:18 AM

## 2016-10-20 NOTE — Progress Notes (Signed)
Report called to RN in ICU.  Pt being transferred to ICU

## 2016-10-20 NOTE — Progress Notes (Signed)
At 1425 hours, this patient began becoming more responsive, pulling at his Bi-PAP mask, trying to speak. RT switched patient to O2 @ 4 lpm via [place]. SpO2 remains in the mid 90's. Patient had oral trial w/ ice chips. Swallowed w/o difficulty. At this time, he is sitting up in bed drinking coffee and talking to his son who is at the bedside. Will continue to monitor.

## 2016-10-21 LAB — BASIC METABOLIC PANEL
ANION GAP: 8 (ref 5–15)
BUN: 37 mg/dL — AB (ref 6–20)
CHLORIDE: 111 mmol/L (ref 101–111)
CO2: 26 mmol/L (ref 22–32)
Calcium: 8.9 mg/dL (ref 8.9–10.3)
Creatinine, Ser: 1.27 mg/dL — ABNORMAL HIGH (ref 0.61–1.24)
GFR, EST AFRICAN AMERICAN: 59 mL/min — AB (ref >60)
GFR, EST NON AFRICAN AMERICAN: 51 mL/min — AB (ref >60)
Glucose, Bld: 90 mg/dL (ref 65–99)
POTASSIUM: 3.7 mmol/L (ref 3.5–5.1)
SODIUM: 145 mmol/L (ref 135–145)

## 2016-10-21 LAB — CBC
HCT: 48 % (ref 40.0–52.0)
HEMOGLOBIN: 15.9 g/dL (ref 13.0–18.0)
MCH: 29.9 pg (ref 26.0–34.0)
MCHC: 33.1 g/dL (ref 32.0–36.0)
MCV: 90.3 fL (ref 80.0–100.0)
PLATELETS: 296 10*3/uL (ref 150–440)
RBC: 5.31 MIL/uL (ref 4.40–5.90)
RDW: 15.1 % — ABNORMAL HIGH (ref 11.5–14.5)
WBC: 14.2 10*3/uL — AB (ref 3.8–10.6)

## 2016-10-21 MED ORDER — MORPHINE SULFATE (CONCENTRATE) 10 MG/0.5ML PO SOLN
5.0000 mg | ORAL | Status: DC | PRN
  Filled 2016-10-21: qty 1

## 2016-10-21 MED ORDER — GLYCOPYRROLATE 0.2 MG/ML IJ SOLN
0.2000 mg | INTRAMUSCULAR | Status: DC | PRN
  Administered 2016-10-21: 23:00:00 0.2 mg via INTRAVENOUS
  Filled 2016-10-21: qty 1

## 2016-10-21 MED ORDER — HALOPERIDOL LACTATE 5 MG/ML IJ SOLN
1.0000 mg | INTRAMUSCULAR | Status: DC | PRN
  Administered 2016-10-21: 2 mg via INTRAVENOUS
  Filled 2016-10-21 (×2): qty 1

## 2016-10-21 MED ORDER — METOPROLOL TARTRATE 5 MG/5ML IV SOLN: 2.5000 mg | INTRAVENOUS | Status: DC | PRN

## 2016-10-21 MED ORDER — LORAZEPAM 1 MG PO TABS: 1.0000 mg | ORAL_TABLET | ORAL | Status: DC | PRN

## 2016-10-21 MED ORDER — LORAZEPAM 2 MG/ML PO CONC: 1.0000 mg | ORAL | Status: DC | PRN

## 2016-10-21 MED ORDER — ONDANSETRON 4 MG PO TBDP
4.0000 mg | ORAL_TABLET | Freq: Four times a day (QID) | ORAL | Status: DC | PRN
  Filled 2016-10-21: qty 1

## 2016-10-21 MED ORDER — ONDANSETRON HCL 4 MG/2ML IJ SOLN
4.0000 mg | Freq: Four times a day (QID) | INTRAMUSCULAR | Status: DC | PRN
Start: 2016-10-21 — End: 2016-10-22

## 2016-10-21 MED ORDER — FENTANYL CITRATE (PF) 100 MCG/2ML IJ SOLN: 25.0000 ug | INTRAMUSCULAR | Status: DC | PRN

## 2016-10-21 MED ORDER — ACETAMINOPHEN 650 MG RE SUPP: 650.0000 mg | Freq: Four times a day (QID) | RECTAL | Status: DC | PRN

## 2016-10-21 MED ORDER — LORAZEPAM 2 MG/ML IJ SOLN
1.0000 mg | INTRAMUSCULAR | Status: DC | PRN
  Administered 2016-10-21 – 2016-10-22 (×2): 1 mg via INTRAVENOUS
  Filled 2016-10-21 (×2): qty 1

## 2016-10-21 MED ORDER — HYDRALAZINE HCL 20 MG/ML IJ SOLN: 10.0000 mg | INTRAMUSCULAR | Status: DC | PRN

## 2016-10-21 MED ORDER — ACETAMINOPHEN 325 MG PO TABS: 650.0000 mg | ORAL_TABLET | Freq: Four times a day (QID) | ORAL | Status: DC | PRN

## 2016-10-21 MED ORDER — MORPHINE SULFATE (CONCENTRATE) 10 MG/0.5ML PO SOLN
5.0000 mg | ORAL | Status: DC | PRN
  Administered 2016-10-21: 5 mg via SUBLINGUAL

## 2016-10-21 MED ORDER — DEXTROSE 5 % IV SOLN
INTRAVENOUS | Status: DC
  Administered 2016-10-21: 1000 mL via INTRAVENOUS

## 2016-10-21 MED ORDER — SODIUM CHLORIDE 0.9 % IV SOLN
0.4000 ug/kg/h | INTRAVENOUS | Status: DC
  Administered 2016-10-21: 0.7 ug/kg/h via INTRAVENOUS
  Administered 2016-10-21: 0.4 ug/kg/h via INTRAVENOUS
  Filled 2016-10-21 (×4): qty 2

## 2016-10-21 MED ORDER — SODIUM CHLORIDE 0.9% FLUSH: 3.0000 mL | INTRAVENOUS | Status: DC | PRN

## 2016-10-21 MED ORDER — SODIUM CHLORIDE 0.9 % IV SOLN: 250.0000 mL | INTRAVENOUS | Status: DC | PRN

## 2016-10-21 MED ORDER — SODIUM CHLORIDE 0.9% FLUSH: 3.0000 mL | Freq: Two times a day (BID) | INTRAVENOUS | Status: DC

## 2016-10-21 NOTE — Progress Notes (Signed)
SLP Cancellation Note  Patient Details Name: Daniel Barr MRN: [id number] DOB: 12/28/1931   Cancelled treatment:       Reason Eval/Treat Not Completed: Patient's level of consciousness;Fatigue/lethargy limiting ability to participate (NSG consulted; chart reviewed)  Pt had to be placed on Precedex last night d/t increased agitation and hallucinations. NSG reported pt was asleep, lethargic. ST services will f/u this afternoon for appropriateness for tx session; po's. NSG agreed.    Daniel [name], MS, CCC-SLP [name] 10/21/2016, 8:12 AM

## 2016-10-21 NOTE — Progress Notes (Signed)
Patient alert with much confusion throughout this shift. Confusion increased throughout the night. Patient became delusional and hallucinating in the room. Patient tried to get out of bed many times. Bed alarm and safety mitts were not enough to keep patient safe. Patient became so agitated and yelling out in his delusions that he was started on a precedex drip. Currently at 0.355mcg/kg/hr. Patient resting comfortably in bed at this time. Still awake but much calmer. Tolerated his lab draw without any problems.

## 2016-10-21 NOTE — Progress Notes (Signed)
Advanced care plan.  Purpose of the Encounter: CODE STATUS  Parties in Attendance: Patient's daughter  Patient's Decision Capacity: Patient confused  Subjective/Patient's story: Patient is 83 year old who has been hospitalized since June 14 has not shown much improvement. Developed acute respiratory failure yesterday. Continues to be confused   Objective/Medical story: I have discussed with patient's daughter regarding patient's further goal of care. Since he has not shown any improvement we will go ahead and proceed with comfort care measures patient's daughter and son are all agreeable to this.    Goals of care determination: Comfort care    CODE STATUS: DO NOT RESUSCITATE   Time spent discussing advanced care planning: 16 minutes

## 2016-10-21 NOTE — Progress Notes (Signed)
Report called to Annice PihJackie, RN on 1C for room 106. Patient will be transported via hospital bed by NT to that room.

## 2016-10-21 NOTE — Evaluation (Signed)
Clinical/Bedside Swallow Evaluation Patient Details  Name: Daniel Barr MRN: [id number] Date of Birth: [birth year]  Today's Date: 10/21/2016 Time: SLP Start Time (ACUTE ONLY): 1145 SLP Stop Time (ACUTE ONLY): 1245 SLP Time Calculation (min) (ACUTE ONLY): 60 min  Past Medical History:  Past Medical History:  Diagnosis Date  . A-fib (HCC)   . CHF (congestive heart failure) (HCC)   . GERD (gastroesophageal reflux disease)   . Hard of hearing   . HLD (hyperlipidemia)   . Hypertension   . Nerve pain    Past Surgical History:  Past Surgical History:  Procedure Laterality Date  . COLONOSCOPY    . JOINT REPLACEMENT     HPI:  Pt is a 83 y.o. male with a known history of CHF, atrial fibrillation, COPD, chronic respiratory failure who was recently in the hospital for right-sided pneumonia and discharged to rehabilitation returns due to worsening weakness and fall. He has been found to have acute kidney injury. Decreased oral intake. Previous head CT revealed moderate to severe chronic small vessel ischemic disease - unsure of pt's baseline Cognitive status. Pt is also extremely HOH; hearing aids were lost. Per this chest xray, stable to regressed patchy right upper lung opacity superimposed on chronic lung disease since the chest CT 10/10/2016 is noted. Pt had a decline in status yesterday morning and was transferred to ICU/SDU with increasing somnolence and unresponsiveness. He was placed on BiPAP with the suspicion for hypercarbic respiratory failure. Pt is more awake/alert and wanting to eat/drink per NSG; po's held until SLP assessment per NSG.    Assessment / Plan / Recommendation Clinical Impression  Pt appears to present w/ min oropharyngeal phase dysphagia secondary to declined Cognitive status; decline in medical and respiratory status'. However, when he was given po trials w/ monitoring/assistance and when following general aspiration precautions, he appeared to adequately tolerate po  trials and appeared at reduced risk for aspiration. Pt does exhibit min increased respiratory effort as a mouth breather/snorer at rest; this does not overtly increase w/ the effort of oral intake but any exertion calmed w/ rest breaks b/t bites/sips. Pt did require feeding assistance and support to reduce exertion and assist d/t mitts in place. Noted no further decline in this CXR compared to previous tests. Recommend a Dysphagia level 3 for easier mastication of meats; Thin liquids. General aspiration precautions; Pills in Puree for easier swallowing. Assistance and monitoring at all meals for follow through w/ precautions. NSG updated. Of note, this evaluation was performed per NSG request d/t pt's decline in medical status requiring a transfer to CCU/SDU since the previous evaluation.  SLP Visit Diagnosis: Dysphagia, oropharyngeal phase (R13.12)    Aspiration Risk  Mild aspiration risk (d/t Cogntive decline; medical and respiratory status')    Diet Recommendation  Dysphagia level 3(mech soft) w/ Thin liquids - moistened foods; general aspiration precautions; feeding support and assistance at meals  Medication Administration: Whole meds with puree (or Crushed if easier for swallowing)    Other  Recommendations Recommended Consults:  (Dietician f/u) Oral Care Recommendations: Oral care BID;Staff/trained caregiver to provide oral care   Follow up Recommendations None      Frequency and Duration            Prognosis Prognosis for Safe Diet Advancement: Fair (-Good) Barriers to Reach Goals: Cognitive deficits      Swallow Study   General Date of Onset: 10/15/2016 HPI: Pt is a 83 y.o. male with a known history of CHF, atrial fibrillation,  COPD, chronic respiratory failure who was recently in the hospital for right-sided pneumonia and discharged to rehabilitation returns due to worsening weakness and fall. He has been found to have acute kidney injury. Decreased oral intake. Previous head CT  revealed moderate to severe chronic small vessel ischemic disease - unsure of pt's baseline Cognitive status. Pt is also extremely HOH; hearing aids were lost. Per this chest xray, stable to regressed patchy right upper lung opacity superimposed on chronic lung disease since the chest CT 10/10/2016 is noted. Pt had a decline in status yesterday morning and was transferred to ICU/SDU with increasing somnolence and unresponsiveness. He was placed on BiPAP with the suspicion for hypercarbic respiratory failure. Pt is more awake/alert and wanting to eat/drink per NSG; po's held until SLP assessment per NSG.  Type of Study: Bedside Swallow Evaluation Previous Swallow Assessment: initial BSE on 10/17/16 Diet Prior to this Study: Dysphagia 3 (soft);Thin liquids (then NPO when moved to CCU d/t decline in status) Temperature Spikes Noted: No (wbc 14.2) Respiratory Status: Nasal cannula (3 liters) History of Recent Intubation: No (BIPAP/CPAP) Behavior/Cognition: Alert;Cooperative;Pleasant mood;Confused;Distractible;Requires cueing (extremely HOH) Oral Cavity Assessment: Within Functional Limits;Dry Oral Care Completed by SLP: Recent completion by staff Oral Cavity - Dentition: Missing dentition Vision: Functional for self-feeding (has mitts on ) Self-Feeding Abilities: Able to feed self;Needs assist;Needs set up;Total assist (has mitts on) Patient Positioning: Upright in bed Baseline Vocal Quality: Normal;Low vocal intensity (mumbled speech at times) Volitional Cough: Cognitively unable to elicit Volitional Swallow: Unable to elicit    Oral/Motor/Sensory Function Overall Oral Motor/Sensory Function: Within functional limits (and w/ bolus management)   Ice Chips Ice chips: Within functional limits Presentation: Spoon (fed; 1 trial)   Thin Liquid Thin Liquid: Within functional limits Presentation: Self Fed;Straw (assisted; ~3-4 ozs )    Nectar Thick Nectar Thick Liquid: Not tested   Honey Thick Honey  Thick Liquid: Not tested   Puree Puree: Within functional limits Presentation: Spoon (fed; 3 trials)   Solid   GO   Solid: Impaired (mech soft trials) Presentation: Spoon (fed; 3 trials) Oral Phase Impairments: Impaired mastication (min increased time/effort) Pharyngeal Phase Impairments:  (none) Other Comments: pt consumed 2-3 bites w/ no significant oral phase deficits and cleared adequately b/t trials         Jerilynn SomKatherine Watson, MS, CCC-SLP Watson,Katherine 10/21/2016,3:02 PM

## 2016-10-21 NOTE — Progress Notes (Signed)
Patient ID: Daniel Barr, male   DOB: 07-17-1931, 83 y.o.   MRN: [id number]  [facility] PROGRESS NOTE  Daniel Barr:[id number] DOB: January 09, 1932 DOA: 10-28-16 PCP: Derwood Kaplan, MD  HPI/Subjective: Daniel Barr: Patient more awake however if she is currently confused Daughter at the bedside    Objective: Vitals:   10/21/16 0300 10/21/16 0400  BP: (!) 169/80 140/69  Pulse: 60 61  Resp: (!) 26 (!) 22  Temp:      Filed Weights   10/20/16 0500 10/21/16 0159 10/21/16 0500  Weight: 210 lb (95.3 kg) 210 lb 12.2 oz (95.6 kg) 206 lb 2.1 oz (93.5 kg)    ROS: Review of Systems  Unable to perform ROS: Mental status change   Exam: Physical Exam  HENT:  Nose: No mucosal edema.  Mouth/Throat: No oropharyngeal exudate or posterior oropharyngeal edema.  Eyes: Conjunctivae, EOM and lids are normal. Pupils are equal, round, and reactive to light.  Neck: No JVD present. Carotid bruit is not present. No edema present. No thyroid mass and no thyromegaly present.  Cardiovascular: S1 normal and S2 normal.  Exam reveals no gallop.   No murmur heard. Pulses:      Dorsalis pedis pulses are 2+ on the right side, and 2+ on the left side.  Respiratory: No respiratory distress. He has decreased breath sounds in the right lower field and the left lower field. He has no wheezes. He has no rhonchi. He has no rales.  GI: Soft. Bowel sounds are normal. There is no tenderness.  Musculoskeletal:       Right ankle: He exhibits swelling.       Left ankle: He exhibits swelling.  Lymphadenopathy:    He has no cervical adenopathy.  Neurological:  Reflex Scores:      Tricep reflexes are 2+ on the right side and 2+ on the left side.      Bicep reflexes are 2+ on the right side and 2+ on the left side.      Brachioradialis reflexes are 2+ on the right side and 2+ on the left side.      Patellar reflexes are 2+ on the right side and 2+ on the left side.      Achilles reflexes are 2+ on the right side  and 2+ on the left side. Confused  Skin: Skin is warm. No rash noted. Nails show no clubbing.  Psychiatric: He has a normal mood and affect.      Data Reviewed: Basic Metabolic Panel:  Recent Labs Lab 10/28/2016 1642 10/17/16 0400 10/18/16 0430 10/20/16 0438 10/21/16 0613  NA 140 146* 146* 145 145  K 4.3 3.6 3.4* 3.7 3.7  CL 107 114* 110 110 111  CO2 26 28 28 27 26   GLUCOSE 116* 96 88 130* 90  BUN 37* 28* 12 20 37*  CREATININE 1.92* 1.28* 0.74 0.89 1.27*  CALCIUM 8.6* 8.6* 8.8* 9.2 8.9   Liver Function Tests:  Recent Labs Lab October 28, 2016 1642 10/17/16 0400  AST 22 22  ALT 20 20  ALKPHOS 67 65  BILITOT 0.7 0.9  PROT 7.0 6.9  ALBUMIN 3.7 3.6    Recent Labs Lab 10-28-2016 1642  LIPASE 43   CBC:  Recent Labs Lab Oct 28, 2016 1642 10/17/16 0400 10/21/16 0613  WBC 11.1* 10.6 14.2*  NEUTROABS 8.0*  --   --   HGB 14.8 14.8 15.9  HCT 44.4 44.6 48.0  MCV 90.4 90.0 90.3  PLT 299 300 296   Cardiac  Enzymes:  Recent Labs Lab 09-Feb-2017 1642  TROPONINI <0.03    CBG:  Recent Labs Lab 10/20/16 0654 10/20/16 0913  GLUCAP 163* 149*    No results found for this or any previous visit (from the past 240 hour(s)).   Studies: Dg Chest Port 1 View  Result Date: 10/20/2016 CLINICAL DATA:  Shortness of breath, atrial fibrillation, CHF, former smoker. EXAM: PORTABLE CHEST 1 VIEW COMPARISON:  Portable chest x-ray of October 19, 2016 FINDINGS: The lungs are adequately inflated. There is no focal infiltrate. The interstitial markings are coarse though stable. The heart is normal in size. The pulmonary vascularity is not engorged. There is calcification in the wall of the aortic arch. The bony thorax is unremarkable. IMPRESSION: Mild chronic bronchitic changes.  No acute pneumonia nor CHF. Thoracic aortic atherosclerosis. Electronically Signed   By: David  SwazilandJordan M.D.   On: 10/20/2016 07:26    Scheduled Meds:  Continuous Infusions:   Assessment/Plan:  1. Acute hypoxic  respiratory failure chest x-ray reviewed from yesterday shows no significant abnormality suspect due to patient's progressive decline, recent CT scan without any pulmonary embolism, Patient's respiratory status waxing and waning currently on oxygen. 2. Right upper lobe pneumonia. Status post antibiotic 3. Chronic respiratory failure with COPD.  4. Essential hypertension on Norvasc if able to take orally 5. History of atrial fibrillation now comfort care only stop all medications 6. History of pulmonary nodules. Stable Weakness. Failure to thrive    I updated patient's daughter who was at the bedside. Patient will be now comfort care    Code Status:     Code Status Orders        Start     Ordered   09-Feb-2017 1804  Do not attempt resuscitation (DNR)  Continuous    Question Answer Comment  In the event of cardiac or respiratory ARREST Do not call a "code blue"   In the event of cardiac or respiratory ARREST Do not perform Intubation, CPR, defibrillation or ACLS   In the event of cardiac or respiratory ARREST Use medication by any route, position, wound care, and other measures to relive pain and suffering. May use oxygen, suction and manual treatment of airway obstruction as needed for comfort.      09-Feb-2017 1804    Code Status History    Date Active Date Inactive Code Status Order ID Comments User Context   10/10/2016 11:17 PM 10/13/2016  8:23 PM DNR [id number]  Tonye RoyaltyHugelmeyer, Alexis, DO Inpatient   10/10/2016 10:11 PM 10/10/2016 11:17 PM Full Code [id number]  Tonye RoyaltyHugelmeyer, Alexis, DO Inpatient   11/22/2015  3:05 AM 11/22/2015  8:03 PM DNR [id number]  Tonye RoyaltyHugelmeyer, Alexis, DO Inpatient   09/22/2015 10:22 PM 09/25/2015  4:46 PM DNR [id number]  Oralia ManisWillis, David, MD Inpatient    Advance Directive Documentation     Most Recent Value  Type of Advance Directive  Out of facility DNR (pink MOST or yellow form)  Pre-existing out of facility DNR order (yellow form or pink MOST form)  -  "MOST" Form in Place?  -      Family Communication: Son  Disposition Plan: To be determined if patient's condition  Antibiotics:  Oral doxycycline and Ceftin  Time spent: 22 minutes of critical care time spent  Oriel Rumbold, [facility] Ltd[facility]REYANG  [facility]

## 2016-11-02 NOTE — Clinical Social Work Note (Signed)
[facility]. CSW is signing off.   Dede QuerySarah Amaiyah Nordhoff, MSW, LCSW  Clinical Social Worker  [phone]

## 2016-11-02 NOTE — Discharge Summary (Signed)
[facility] - [place] [facility] Date of Admission: 08-14-16  3:48 PM  Date of death:   Admitting diagnosis: *     Diagnosis at time of death 1.right upper lobe pna 2. Acute hypoxic respiratory failure 3. Chronic respiratory failure due to COPD 4. Essential hypertension 5. H/o pulmonary nodule 6. chf chronic    Hospital course  Daniel Barr  is a 83 y.o. male with a known history of CHF, atrial fibrillation, COPD, chronic respiratory failure who was recently in the hospital for right-sided pneumonia and discharged to rehabilitation returns due to worsening weakness and fall. He has been found to have acute kidney injury. Decreased oral intake. Patient was admitted for further evaluation. During the hospitalization he failed to improve. His developed acute respiratory failure and required BiPAP therapy. Patient's condition continued to wax and wane therefore case was discussed with the daughter and They Decided to Make Him Comfort Care. Soon after Making Him Comfort Care Patient Passed Away.          TOTAL TIME TAKING CARE OF THIS PATIENT:8435minutes.    Auburn BilberryPATEL, Labrina Lines M.D on 10/23/2016 at 1:07 PM  Between 7am to 6pm - Pager - [phone]  After 6pm go to [email or web address] - password [email or web address] [facility]  [facility]  Office  [phone]  CC: Primary care physician; Derwood KaplanEason, Ernest B, MD

## 2016-11-02 DEATH — deceased

## 2016-11-03 ENCOUNTER — Ambulatory Visit: Payer: Medicare Other

## 2017-02-17 LAB — HIV ANTIBODY (ROUTINE TESTING W REFLEX): HIV SCREEN 4TH GENERATION: NONREACTIVE

## 2019-04-28 IMAGING — CT CT CHEST W/ CM
2 of 5 series · 12 of 36 positions shown, 15 images · IV contrast (iopamidol)
Comparison: Chest CT exams dating back through 09/22/2015.

CLINICAL DATA: Low-grade fever, leukocytosis and generalized
weakness.

EXAM:
CT CHEST, ABDOMEN, AND PELVIS WITH CONTRAST
TECHNIQUE: Multidetector CT imaging of the chest, abdomen and pelvis was
performed following the standard protocol during bolus
administration of intravenous contrast.
CONTRAST:  100mL [id number] IOPAMIDOL ([id number]) INJECTION 61%

[Series 2: cap with · axial · 0.92mm/px · z∈[-710,-175]mm · 9 of 135 slices shown, 12 images]
[im 14/135  mediastinal]
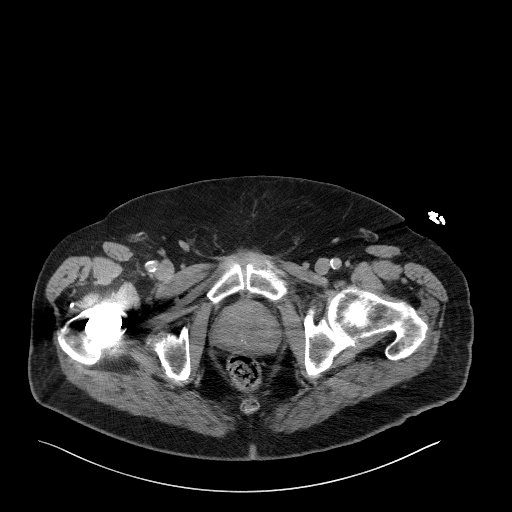
[im 14/135  lung]
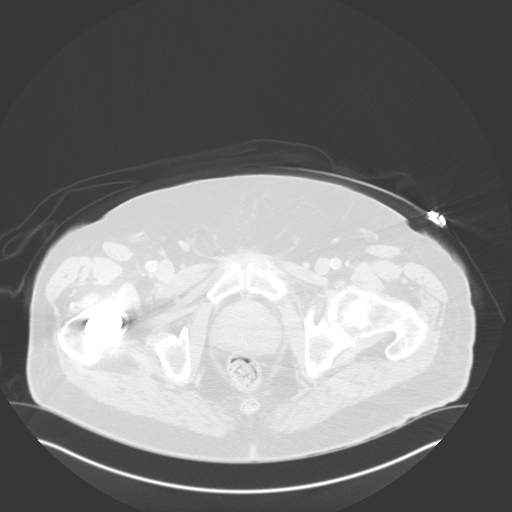
[im 27/135  lung]
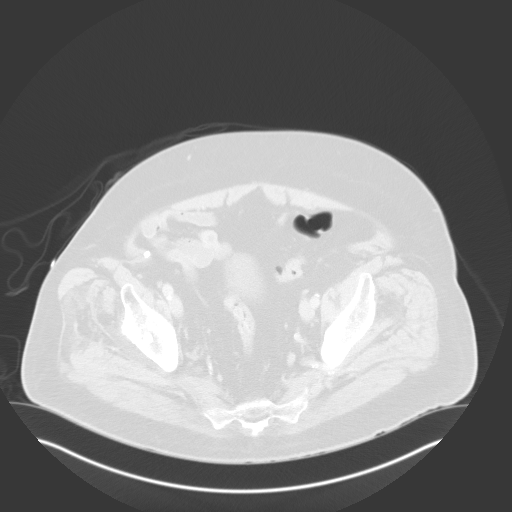
[im 41/135  lung]
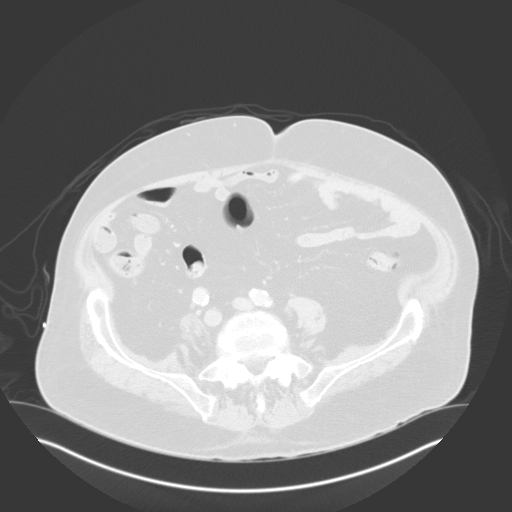
[im 54/135  lung]
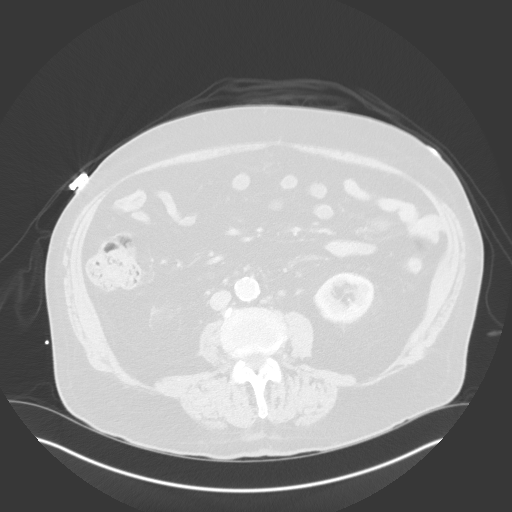
[im 68/135  mediastinal]
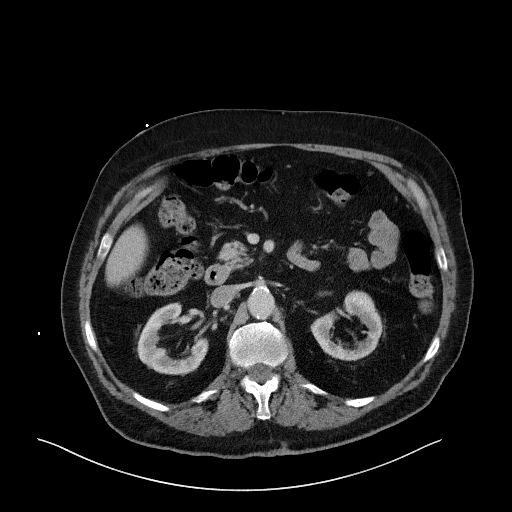
[im 68/135  lung]
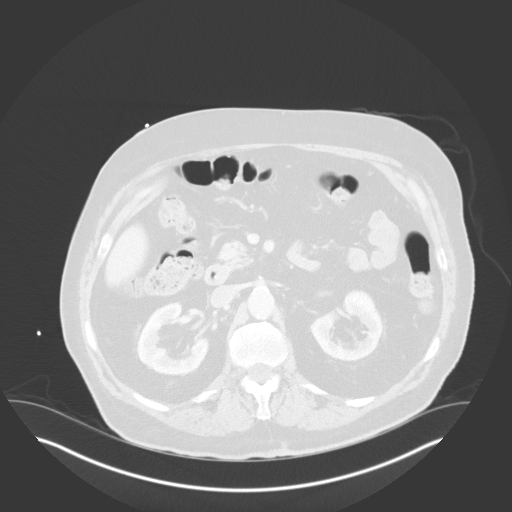
[im 81/135  lung]
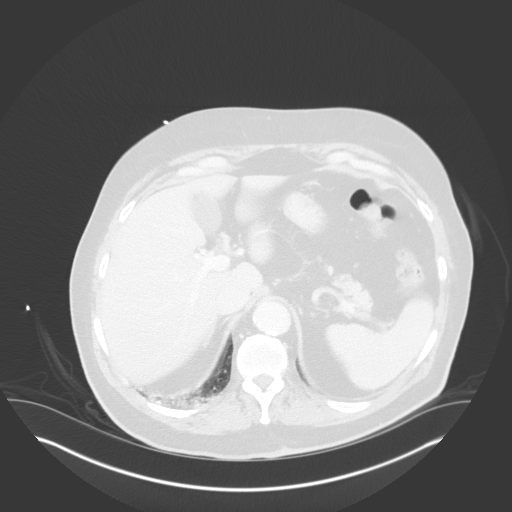
[im 94/135  lung]
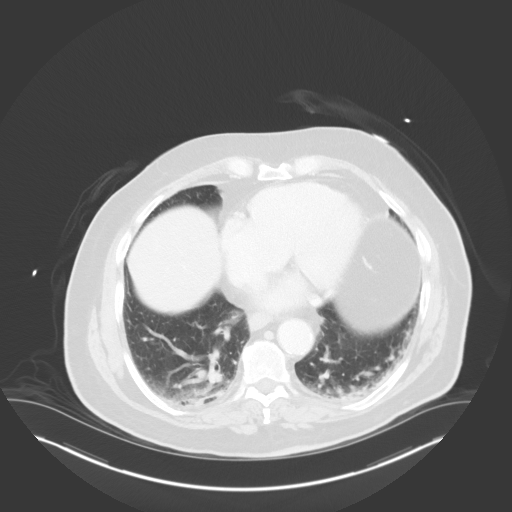
[im 108/135  lung]
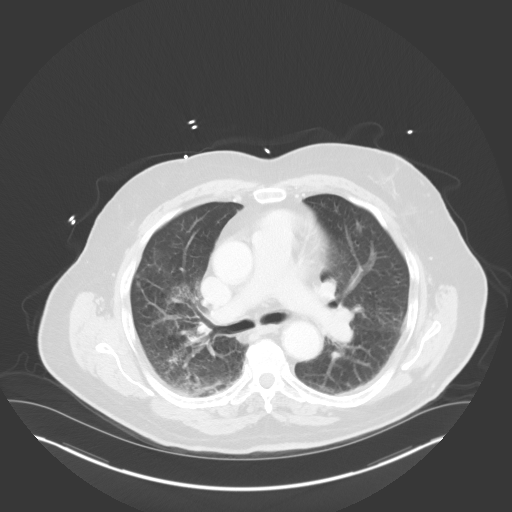
[im 121/135  mediastinal]
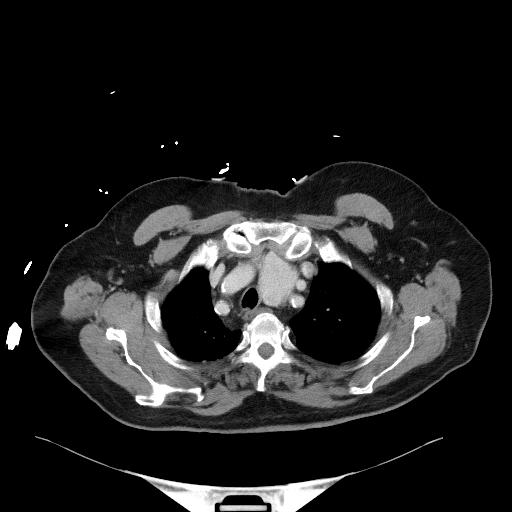
[im 121/135  lung]
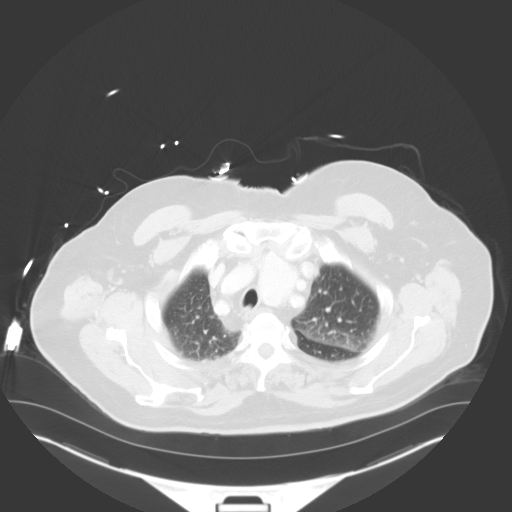

[Series 5: coronals · coronal · 0.89mm/px · 3 of 150 slices shown]
[im 30/150  lung]
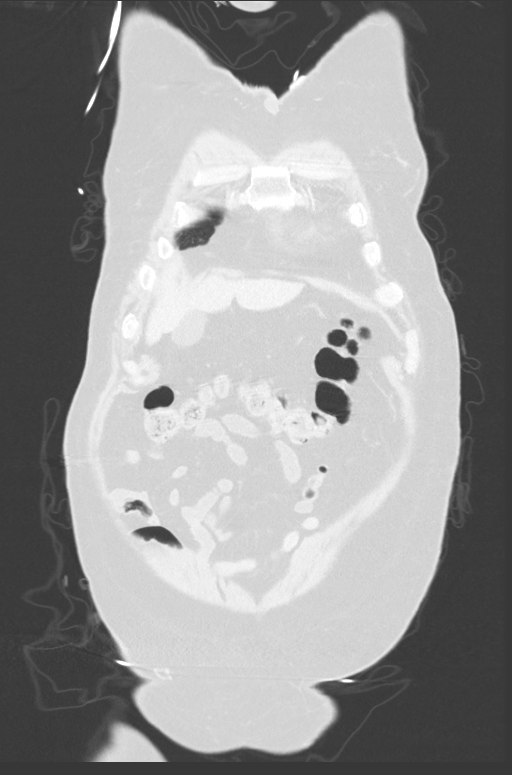
[im 60/150  lung]
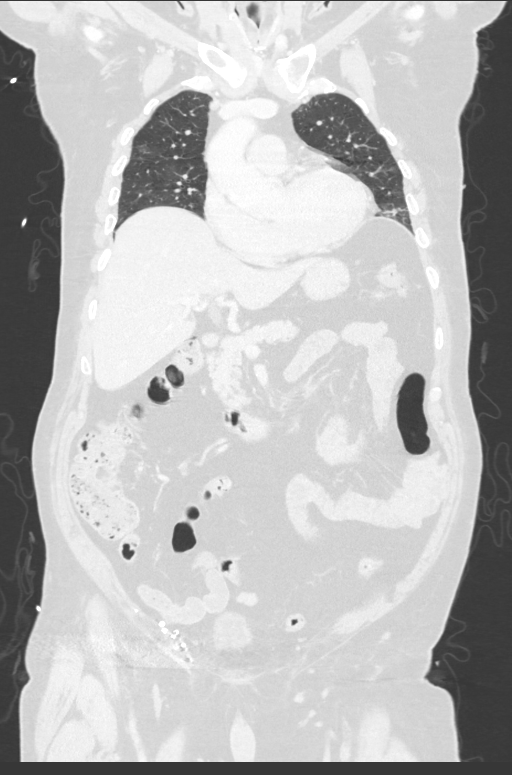
[im 90/150  lung]
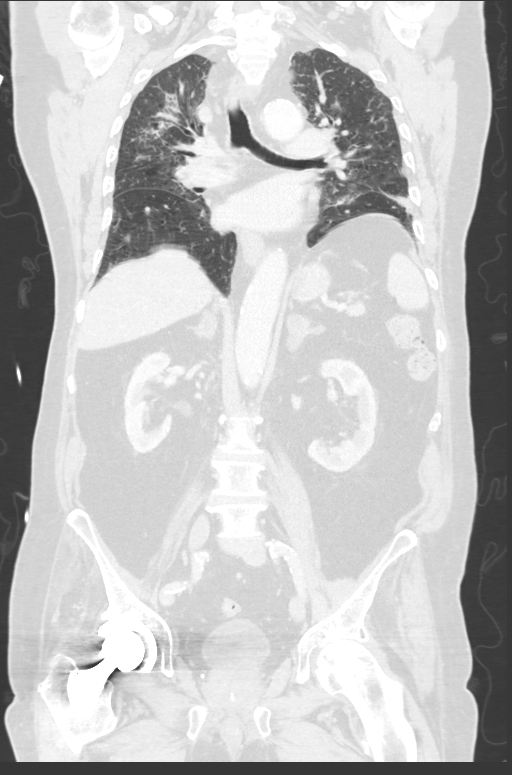

[12 of 36 positions shown; findings below may reference images not displayed]

FINDINGS: CT CHEST FINDINGS

Cardiovascular: Borderline cardiomegaly with coronary
arteriosclerosis along the left main, lad, RCA and circumflex. No
pericardial effusion. Aortic atherosclerosis without aneurysmal
dilatation or dissection. No large central pulmonary embolus is
noted.

Mediastinum/Nodes: Substernal extension of the thyroid gland with
stable appearing benign-appearing subcentimeter hypodensities
consistent with tiny nodules. Coarse calcification is seen a right
internal thyroid gland. No mediastinal or hilar adenopathy. Trachea
and mainstem bronchi as well as esophagus are normal in appearance.

Lungs/Pleura: Stable right upper and right middle lobe nodules are
noted, the right upper lobe nodules measuring between 5 and 6 mm on
series 4 image 70 and 71 with right middle lobe subpleural nodules
measuring 5 and 11 mm in maximum dimension, series 4, image 91 and
94. Centrilobular emphysema is noted in the upper lobes with
superimposed airspace disease in the right upper lobe suspicious for
concomitant pneumonia. Dependent atelectasis with subsegmental
atelectasis noted in both lower lobes and lingula. No effusion or
pneumothorax.

Musculoskeletal: No chest wall mass or suspicious bone lesions
identified.

CT ABDOMEN PELVIS FINDINGS

Hepatobiliary: No focal liver abnormality is seen. No gallstones,
gallbladder wall thickening, or biliary dilatation.

Pancreas: Unremarkable. No pancreatic ductal dilatation or
surrounding inflammatory changes.

Spleen: Normal in size without focal abnormality.

Adrenals/Urinary Tract: Hypodense 26, 18 and 11 nodules of the left
adrenal gland are identified with Hounsfield units consistent with
benign adenomas. Stable mild nodular thickening of the right adrenal
gland. Mild bilateral renal cortical thinning. 12 mm water
attenuating exophytic cyst off the interpolar right kidney. No
nephrolithiasis, hydronephrosis nor hydroureter. Urinary bladder is
unremarkable.

Stomach/Bowel: The stomach is contracted. There is normal small
bowel rotation without small-bowel obstruction or inflammation. The
appendix is not visualized but no right lower quadrant or pericecal
inflammation is seen. A moderate amount of stool noted within large
bowel. Scattered colonic diverticulosis is noted without acute
diverticulitis.

Vascular/Lymphatic: Aortoiliac and branch vessel atherosclerosis. No
aneurysm or dissection. Patent portal and splenic veins.

Reproductive: Enlarged prostate measuring 5.1 x 6.4 x 5.3  cm.

Other: Status post right inguinal hernia repair.

Musculoskeletal: Degenerative disc disease at L5-S1. Right hip
arthroplasty. No acute nor suspicious osseous lesions.
IMPRESSION: 1. Stable right upper and right middle lobe subpleural pulmonary
nodules largest is approximately 11 mm in the right middle lobe
unchanged since 09/22/2015 CT. Given stability of the nodules, a
future CT at 18-24 months (from initial scan performed 09/22/2015) is
considered optional for low-risk patients, but is recommended for
high-risk patients. This recommendation follows the consensus
statement: Guidelines for Management of Incidental Pulmonary Nodules
Detected on CT Images: From the [HOSPITAL] 8720; Radiology
8720; [DATE].
2. COPD with superimposed airspace disease in the right upper lobe
suspicious for pneumonia.
3. Mild thyromegaly with tiny subcentimeter hypodense nodules
without worrisome features, stable in appearance.
4. Stable left-sided adrenal nodules with enhancement consistent
with benign adenomas.
5. Coronary arteriosclerosis and aortic atherosclerosis.
6. Tiny water attenuating 12 mm cyst off the interpolar right
kidney.
7. Colonic diverticulosis without acute diverticulitis. No bowel
inflammation or obstruction.
8. Enlarged prostate.
9. Status post right inguinal hernia repair and right hip
arthroplasty

## 2019-05-08 IMAGING — DX DG CHEST 1V PORT
1 series · 1 of 1 positions shown · non-contrast
Comparison: Portable chest x-ray October 19, 2016

CLINICAL DATA: Shortness of breath, atrial fibrillation, CHF,
former smoker.

EXAM:
PORTABLE CHEST 1 VIEW

[chest ap]
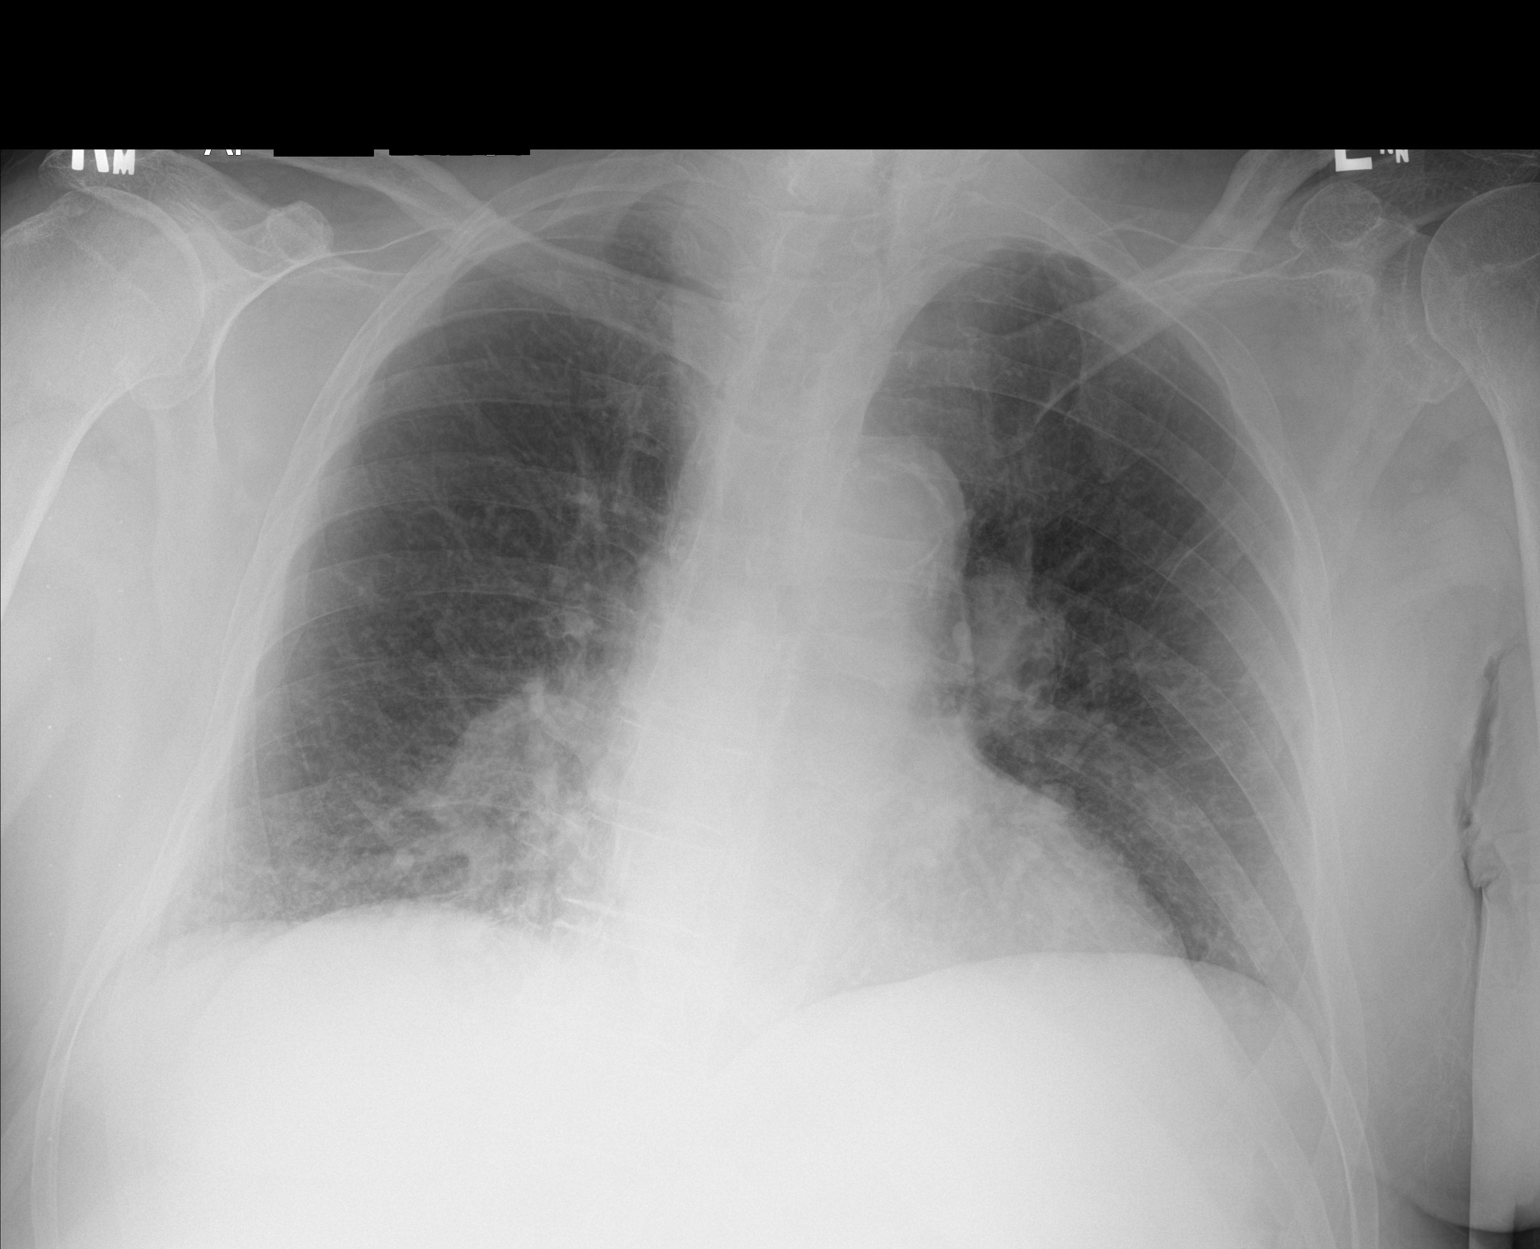

[1 of 1 positions shown; findings below may reference images not displayed]

FINDINGS: The lungs are adequately inflated. There is no focal infiltrate. The
interstitial markings are coarse though stable. The heart is normal
in size. The pulmonary vascularity is not engorged. There is
calcification in the wall of the aortic arch. The bony thorax is
unremarkable.
IMPRESSION: Mild chronic bronchitic changes.  No acute pneumonia nor CHF.

Thoracic aortic atherosclerosis.
